# Patient Record
Sex: Male | Born: 1980 | Race: White | Hispanic: No | Marital: Single | Smoking: Current every day smoker
Health system: Southern US, Community
[De-identification: ages and names within clinical notes are randomized; demographics above are authoritative.]

## PROBLEM LIST (undated history)

## (undated) DIAGNOSIS — S42009A Fracture of unspecified part of unspecified clavicle, initial encounter for closed fracture: Secondary | ICD-10-CM

---

## 2000-10-15 ENCOUNTER — Encounter: Payer: Self-pay | Admitting: Emergency Medicine

## 2000-10-15 ENCOUNTER — Emergency Department (HOSPITAL_COMMUNITY): Admission: EM | Admit: 2000-10-15 | Discharge: 2000-10-15 | Payer: Self-pay | Admitting: Emergency Medicine

## 2000-12-19 ENCOUNTER — Encounter: Payer: Self-pay | Admitting: Internal Medicine

## 2000-12-19 ENCOUNTER — Emergency Department (HOSPITAL_COMMUNITY): Admission: EM | Admit: 2000-12-19 | Discharge: 2000-12-19 | Payer: Self-pay | Admitting: Internal Medicine

## 2000-12-31 ENCOUNTER — Emergency Department (HOSPITAL_COMMUNITY): Admission: EM | Admit: 2000-12-31 | Discharge: 2000-12-31 | Payer: Self-pay | Admitting: *Deleted

## 2001-02-03 ENCOUNTER — Emergency Department (HOSPITAL_COMMUNITY): Admission: EM | Admit: 2001-02-03 | Discharge: 2001-02-03 | Payer: Self-pay | Admitting: Emergency Medicine

## 2001-02-21 ENCOUNTER — Encounter: Payer: Self-pay | Admitting: Emergency Medicine

## 2001-02-21 ENCOUNTER — Emergency Department (HOSPITAL_COMMUNITY): Admission: EM | Admit: 2001-02-21 | Discharge: 2001-02-21 | Payer: Self-pay | Admitting: Emergency Medicine

## 2001-04-06 ENCOUNTER — Emergency Department (HOSPITAL_COMMUNITY): Admission: EM | Admit: 2001-04-06 | Discharge: 2001-04-06 | Payer: Self-pay | Admitting: Emergency Medicine

## 2001-04-06 ENCOUNTER — Encounter: Payer: Self-pay | Admitting: Emergency Medicine

## 2001-06-05 ENCOUNTER — Emergency Department (HOSPITAL_COMMUNITY): Admission: EM | Admit: 2001-06-05 | Discharge: 2001-06-05 | Payer: Self-pay | Admitting: *Deleted

## 2001-06-05 ENCOUNTER — Encounter: Payer: Self-pay | Admitting: *Deleted

## 2001-07-04 ENCOUNTER — Emergency Department (HOSPITAL_COMMUNITY): Admission: EM | Admit: 2001-07-04 | Discharge: 2001-07-05 | Payer: Self-pay | Admitting: Internal Medicine

## 2001-07-04 ENCOUNTER — Encounter: Payer: Self-pay | Admitting: Internal Medicine

## 2001-07-04 ENCOUNTER — Emergency Department (HOSPITAL_COMMUNITY): Admission: EM | Admit: 2001-07-04 | Discharge: 2001-07-04 | Payer: Self-pay | Admitting: Emergency Medicine

## 2001-07-29 ENCOUNTER — Emergency Department (HOSPITAL_COMMUNITY): Admission: EM | Admit: 2001-07-29 | Discharge: 2001-07-29 | Payer: Self-pay | Admitting: Emergency Medicine

## 2001-08-10 ENCOUNTER — Emergency Department (HOSPITAL_COMMUNITY): Admission: EM | Admit: 2001-08-10 | Discharge: 2001-08-10 | Payer: Self-pay | Admitting: *Deleted

## 2001-09-30 ENCOUNTER — Emergency Department (HOSPITAL_COMMUNITY): Admission: EM | Admit: 2001-09-30 | Discharge: 2001-09-30 | Payer: Self-pay | Admitting: Emergency Medicine

## 2001-09-30 ENCOUNTER — Encounter: Payer: Self-pay | Admitting: Emergency Medicine

## 2001-11-09 ENCOUNTER — Emergency Department (HOSPITAL_COMMUNITY): Admission: EM | Admit: 2001-11-09 | Discharge: 2001-11-09 | Payer: Self-pay | Admitting: *Deleted

## 2001-11-09 ENCOUNTER — Encounter: Payer: Self-pay | Admitting: *Deleted

## 2001-11-30 ENCOUNTER — Emergency Department (HOSPITAL_COMMUNITY): Admission: EM | Admit: 2001-11-30 | Discharge: 2001-12-01 | Payer: Self-pay | Admitting: Emergency Medicine

## 2002-02-01 ENCOUNTER — Emergency Department (HOSPITAL_COMMUNITY): Admission: EM | Admit: 2002-02-01 | Discharge: 2002-02-02 | Payer: Self-pay | Admitting: Emergency Medicine

## 2002-02-02 ENCOUNTER — Encounter: Payer: Self-pay | Admitting: Emergency Medicine

## 2002-02-02 ENCOUNTER — Emergency Department (HOSPITAL_COMMUNITY): Admission: EM | Admit: 2002-02-02 | Discharge: 2002-02-02 | Payer: Self-pay | Admitting: Emergency Medicine

## 2002-04-15 ENCOUNTER — Emergency Department (HOSPITAL_COMMUNITY): Admission: EM | Admit: 2002-04-15 | Discharge: 2002-04-15 | Payer: Self-pay | Admitting: *Deleted

## 2002-06-23 ENCOUNTER — Emergency Department (HOSPITAL_COMMUNITY): Admission: EM | Admit: 2002-06-23 | Discharge: 2002-06-23 | Payer: Self-pay | Admitting: Emergency Medicine

## 2002-07-27 ENCOUNTER — Emergency Department (HOSPITAL_COMMUNITY): Admission: EM | Admit: 2002-07-27 | Discharge: 2002-07-27 | Payer: Self-pay | Admitting: Emergency Medicine

## 2002-07-31 ENCOUNTER — Emergency Department (HOSPITAL_COMMUNITY): Admission: EM | Admit: 2002-07-31 | Discharge: 2002-07-31 | Payer: Self-pay | Admitting: Emergency Medicine

## 2002-09-22 ENCOUNTER — Emergency Department (HOSPITAL_COMMUNITY): Admission: EM | Admit: 2002-09-22 | Discharge: 2002-09-22 | Payer: Self-pay | Admitting: Emergency Medicine

## 2003-05-23 ENCOUNTER — Emergency Department (HOSPITAL_COMMUNITY): Admission: EM | Admit: 2003-05-23 | Discharge: 2003-05-23 | Payer: Self-pay | Admitting: Emergency Medicine

## 2003-09-09 ENCOUNTER — Emergency Department (HOSPITAL_COMMUNITY): Admission: EM | Admit: 2003-09-09 | Discharge: 2003-09-10 | Payer: Self-pay | Admitting: *Deleted

## 2005-04-28 ENCOUNTER — Emergency Department (HOSPITAL_COMMUNITY): Admission: EM | Admit: 2005-04-28 | Discharge: 2005-04-29 | Payer: Self-pay | Admitting: *Deleted

## 2007-05-03 ENCOUNTER — Emergency Department (HOSPITAL_COMMUNITY): Admission: EM | Admit: 2007-05-03 | Discharge: 2007-05-03 | Payer: Self-pay | Admitting: Emergency Medicine

## 2012-09-26 ENCOUNTER — Emergency Department (HOSPITAL_COMMUNITY)
Admission: EM | Admit: 2012-09-26 | Discharge: 2012-09-26 | Disposition: A | Discharge status: Home / Self Care | Payer: Self-pay | Attending: Emergency Medicine | Admitting: Emergency Medicine

## 2012-09-26 ENCOUNTER — Encounter (HOSPITAL_COMMUNITY): Payer: Self-pay

## 2012-09-26 DIAGNOSIS — K047 Periapical abscess without sinus: Secondary | ICD-10-CM | POA: Insufficient documentation

## 2012-09-26 DIAGNOSIS — F17200 Nicotine dependence, unspecified, uncomplicated: Secondary | ICD-10-CM | POA: Insufficient documentation

## 2012-09-26 DIAGNOSIS — K08109 Complete loss of teeth, unspecified cause, unspecified class: Secondary | ICD-10-CM | POA: Insufficient documentation

## 2012-09-26 DIAGNOSIS — K029 Dental caries, unspecified: Secondary | ICD-10-CM | POA: Insufficient documentation

## 2012-09-26 DIAGNOSIS — G8911 Acute pain due to trauma: Secondary | ICD-10-CM | POA: Insufficient documentation

## 2012-09-26 MED ORDER — HYDROCODONE-ACETAMINOPHEN 5-325 MG PO TABS: 1.0000 | ORAL_TABLET | ORAL | Status: DC | PRN

## 2012-09-26 MED ORDER — AMOXICILLIN 500 MG PO CAPS: 500.0000 mg | ORAL_CAPSULE | Freq: Three times a day (TID) | ORAL | Status: AC

## 2012-09-26 MED ORDER — IBUPROFEN 600 MG PO TABS: 600.0000 mg | ORAL_TABLET | Freq: Four times a day (QID) | ORAL | Status: DC | PRN

## 2012-09-26 NOTE — ED Notes (Signed)
Pain right lower molar

## 2012-09-26 NOTE — ED Notes (Signed)
Pain rt mandibular molar for 3 days, The molar is broken and has  Cavity.

## 2012-09-26 NOTE — ED Provider Notes (Signed)
Medical screening examination/treatment/procedure(s) were performed by non-physician practitioner and as supervising physician I was immediately available for consultation/collaboration.  Flint Melter, MD 09/26/12 2330

## 2012-09-26 NOTE — ED Provider Notes (Signed)
History     CSN: [id number]  Arrival date & time 09/26/12  1904   First MD Initiated Contact with Patient 09/26/12 2012      Chief Complaint  Patient presents with  . Dental Pain    (Consider location/radiation/quality/duration/timing/severity/associated sxs/prior treatment) HPI Comments: Marcus Gill is a 32 y.o. male presenting with a 3 day history of dental pain and gingival swelling.   The patient has a history of injury to the tooth involved which has recently started to cause pain.  There has been no fevers,  Chills, nausea or vomiting, also no complaint of difficulty swallowing,  Although chewing makes pain worse.  The patient has tried tylenol and ibuprofen without relief of symptoms.      The history is provided by the patient.    History reviewed. No pertinent past medical history.  History reviewed. No pertinent past surgical history.  History reviewed. No pertinent family history.  History  Substance Use Topics  . Smoking status: Current Every Day Smoker -- 1.00 packs/day for 15 years    Types: Cigarettes  . Smokeless tobacco: Not on file  . Alcohol Use: No      Review of Systems  Constitutional: Negative for fever.  HENT: Positive for dental problem. Negative for sore throat, facial swelling, neck pain and neck stiffness.   Respiratory: Negative for shortness of breath.     Allergies  Darvocet  Home Medications   Current Outpatient Rx  Name  Route  Sig  Dispense  Refill  . acetaminophen (TYLENOL) 500 MG tablet   Oral   Take 500-1,000 mg by mouth daily as needed for pain.         Marland Kitchen ibuprofen (ADVIL,MOTRIN) 200 MG tablet   Oral   Take 400 mg by mouth daily as needed for pain.         Marland Kitchen amoxicillin (AMOXIL) 500 MG capsule   Oral   Take 1 capsule (500 mg total) by mouth 3 (three) times daily.   30 capsule   0   . HYDROcodone-acetaminophen (NORCO/VICODIN) 5-325 MG per tablet   Oral   Take 1 tablet by mouth every 4 (four) hours as needed  for pain.   15 tablet   0   . ibuprofen (ADVIL,MOTRIN) 600 MG tablet   Oral   Take 1 tablet (600 mg total) by mouth every 6 (six) hours as needed for pain.   30 tablet   0     BP 134/86  Pulse 71  Temp(Src) 97.8 F (36.6 C) (Oral)  Resp 16  Ht 6' (1.829 m)  Wt 159 lb (72.122 kg)  BMI 21.56 kg/m2  SpO2 100%  Physical Exam  Constitutional: He is oriented to person, place, and time. He appears well-developed and well-nourished. No distress.  HENT:  Head: Normocephalic and atraumatic. No trismus in the jaw.  Right Ear: Tympanic membrane and external ear normal.  Left Ear: Tympanic membrane and external ear normal.  Mouth/Throat: Oropharynx is clear and moist and mucous membranes are normal. No oral lesions. Dental abscesses present.    Generalized poor dentition with several missing teeth, multiple teeth with deep cavities.  Poor oral hygiene.  Eyes: Conjunctivae are normal.  Neck: Normal range of motion. Neck supple.  Cardiovascular: Normal rate and normal heart sounds.   Pulmonary/Chest: Effort normal.  Abdominal: He exhibits no distension.  Musculoskeletal: Normal range of motion.  Lymphadenopathy:    He has no cervical adenopathy.  Neurological: He is alert and oriented  to person, place, and time.  Skin: Skin is warm and dry. No erythema.  Psychiatric: He has a normal mood and affect.    ED Course  Procedures (including critical care time)  Labs Reviewed - No data to display No results found.   1. Dental abscess       MDM  Patient was prescribed Amoxil and hydrocodone.  He was given referrals to local dentist and the free clinic coming up within the next month.  The patient appears reasonably screened and/or stabilized for discharge and I doubt any other medical condition or other [facility] requiring further screening, evaluation, or treatment in the ED at this time prior to discharge.         Burgess Amor, PA-C 09/26/12 2103

## 2012-10-18 ENCOUNTER — Emergency Department (HOSPITAL_COMMUNITY)
Admission: EM | Admit: 2012-10-18 | Discharge: 2012-10-18 | Disposition: A | Discharge status: Home / Self Care | Payer: Self-pay | Attending: Emergency Medicine | Admitting: Emergency Medicine

## 2012-10-18 ENCOUNTER — Encounter (HOSPITAL_COMMUNITY): Payer: Self-pay | Admitting: Emergency Medicine

## 2012-10-18 DIAGNOSIS — F17200 Nicotine dependence, unspecified, uncomplicated: Secondary | ICD-10-CM | POA: Insufficient documentation

## 2012-10-18 DIAGNOSIS — R6884 Jaw pain: Secondary | ICD-10-CM | POA: Insufficient documentation

## 2012-10-18 DIAGNOSIS — K029 Dental caries, unspecified: Secondary | ICD-10-CM | POA: Insufficient documentation

## 2012-10-18 DIAGNOSIS — K089 Disorder of teeth and supporting structures, unspecified: Secondary | ICD-10-CM | POA: Insufficient documentation

## 2012-10-18 MED ORDER — KETOROLAC TROMETHAMINE 10 MG PO TABS
10.0000 mg | ORAL_TABLET | Freq: Once | ORAL | Status: AC
  Administered 2012-10-18: 10 mg via ORAL
  Filled 2012-10-18: qty 1

## 2012-10-18 MED ORDER — LIDOCAINE HCL (PF) 1 % IJ SOLN
INTRAMUSCULAR | Status: AC
  Administered 2012-10-18: 5 mL
  Filled 2012-10-18: qty 5

## 2012-10-18 MED ORDER — CEFTRIAXONE SODIUM 1 G IJ SOLR
1.0000 g | Freq: Once | INTRAMUSCULAR | Status: AC
  Administered 2012-10-18: 1 g via INTRAMUSCULAR
  Filled 2012-10-18: qty 10

## 2012-10-18 MED ORDER — ONDANSETRON HCL 4 MG PO TABS
4.0000 mg | ORAL_TABLET | Freq: Once | ORAL | Status: AC
  Administered 2012-10-18: 4 mg via ORAL
  Filled 2012-10-18: qty 1

## 2012-10-18 MED ORDER — HYDROCODONE-ACETAMINOPHEN 5-325 MG PO TABS
2.0000 | ORAL_TABLET | Freq: Once | ORAL | Status: AC
  Administered 2012-10-18: 2 via ORAL
  Filled 2012-10-18: qty 2

## 2012-10-18 MED ORDER — MELOXICAM 7.5 MG PO TABS: ORAL_TABLET | ORAL | Status: DC

## 2012-10-18 MED ORDER — HYDROCODONE-ACETAMINOPHEN 5-325 MG PO TABS: 1.0000 | ORAL_TABLET | ORAL | Status: DC | PRN

## 2012-10-18 MED ORDER — PENICILLIN V POTASSIUM 500 MG PO TABS: ORAL_TABLET | ORAL | Status: DC

## 2012-10-18 NOTE — ED Provider Notes (Signed)
History     CSN: [id number]  Arrival date & time 10/18/12  1451   First MD Initiated Contact with Patient 10/18/12 [phone]      Chief Complaint  Patient presents with  . Dental Pain    (Consider location/radiation/quality/duration/timing/severity/associated sxs/prior treatment) Patient is a 32 y.o. male presenting with tooth pain.  Dental PainThe primary symptoms include mouth pain. Primary symptoms do not include shortness of breath or cough. The symptoms began more than 1 week ago. The symptoms are worsening. The symptoms occur constantly.  Additional symptoms include: gum swelling and jaw pain. Additional symptoms do not include: nosebleeds. Medical issues include: smoking.    History reviewed. No pertinent past medical history.  History reviewed. No pertinent past surgical history.  History reviewed. No pertinent family history.  History  Substance Use Topics  . Smoking status: Current Every Day Smoker -- 1.00 packs/day for 15 years    Types: Cigarettes  . Smokeless tobacco: Not on file  . Alcohol Use: No      Review of Systems  Constitutional: Negative for activity change.       All ROS Neg except as noted in HPI  HENT: Positive for dental problem. Negative for nosebleeds and neck pain.   Eyes: Negative for photophobia and discharge.  Respiratory: Negative for cough, shortness of breath and wheezing.   Cardiovascular: Negative for chest pain and palpitations.  Gastrointestinal: Negative for abdominal pain and blood in stool.  Genitourinary: Negative for dysuria, frequency and hematuria.  Musculoskeletal: Negative for back pain and arthralgias.  Skin: Negative.   Neurological: Negative for dizziness, seizures and speech difficulty.  Psychiatric/Behavioral: Negative for hallucinations and confusion.    Allergies  Darvocet  Home Medications   Current Outpatient Rx  Name  Route  Sig  Dispense  Refill  . acetaminophen (TYLENOL) 500 MG tablet   Oral   Take  500-1,000 mg by mouth daily as needed for pain.         Marland Kitchen HYDROcodone-acetaminophen (NORCO/VICODIN) 5-325 MG per tablet   Oral   Take 1 tablet by mouth every 4 (four) hours as needed for pain.   15 tablet   0   . ibuprofen (ADVIL,MOTRIN) 200 MG tablet   Oral   Take 400 mg by mouth daily as needed for pain.         Marland Kitchen ibuprofen (ADVIL,MOTRIN) 600 MG tablet   Oral   Take 1 tablet (600 mg total) by mouth every 6 (six) hours as needed for pain.   30 tablet   0     BP 118/79  Pulse 79  Temp(Src) 97.8 F (36.6 C) (Oral)  Resp 18  SpO2 99%  Physical Exam  Nursing note and vitals reviewed. Constitutional: He is oriented to person, place, and time. He appears well-developed and well-nourished.  Non-toxic appearance.  HENT:  Head: Normocephalic.  Right Ear: Tympanic membrane and external ear normal.  Left Ear: Tympanic membrane and external ear normal.  Mouth/Throat: Uvula is midline. Dental caries present.    Eyes: EOM and lids are normal. Pupils are equal, round, and reactive to light.  Neck: Normal range of motion. Neck supple. Carotid bruit is not present.  Cardiovascular: Normal rate, regular rhythm, normal heart sounds, intact distal pulses and normal pulses.   Pulmonary/Chest: Breath sounds normal. No respiratory distress.  Abdominal: Soft. Bowel sounds are normal. There is no tenderness. There is no guarding.  Musculoskeletal: Normal range of motion.  Lymphadenopathy:       Head (  right side): No submandibular adenopathy present.       Head (left side): No submandibular adenopathy present.    He has no cervical adenopathy.  Neurological: He is alert and oriented to person, place, and time. He has normal strength. No cranial nerve deficit or sensory deficit.  Skin: Skin is warm and dry.  Psychiatric: He has a normal mood and affect. His speech is normal.    ED Course  Procedures (including critical care time)  Labs Reviewed - No data to display No results  found.   No diagnosis found.    MDM  I have reviewed nursing notes, vital signs, and all appropriate lab and imaging results for this patient. The patient has had problems with dental cavities for quite some time. He was seen on April 25 with abscess and swelling and pain involving the right side of his jaw. The pain got" a little better" and that came back. There's been no fever or chills reported.  The patient has a deep cavity of the first lower molar on the right side. The plan at this time is for the patient be treated with an intramuscular injection of Rocephin. He will again be treated with amoxicillin, Mobic for swelling, and Norco for pain. Patient states he plans to be seen by a free clinic on May 30.       Kathie Dike, PA-C 10/18/12 1529

## 2012-10-18 NOTE — ED Provider Notes (Signed)
Medical screening examination/treatment/procedure(s) were performed by non-physician practitioner and as supervising physician I was immediately available for consultation/collaboration.  Aliesha Dolata, MD 10/18/12 2351 

## 2012-10-18 NOTE — ED Notes (Signed)
Pt presents with abscess to right side on mouth. Pt states he was seen here on 4/25 and given abx. Pt states abscess initially "went down" but states "it came back and now it is bigger and more painful". Pt denies any drainage from area.

## 2013-09-19 ENCOUNTER — Emergency Department (HOSPITAL_COMMUNITY)
Admission: EM | Admit: 2013-09-19 | Discharge: 2013-09-19 | Disposition: A | Discharge status: Home / Self Care | Payer: Self-pay | Attending: Emergency Medicine | Admitting: Emergency Medicine

## 2013-09-19 ENCOUNTER — Encounter (HOSPITAL_COMMUNITY): Payer: Self-pay | Admitting: Emergency Medicine

## 2013-09-19 DIAGNOSIS — M546 Pain in thoracic spine: Secondary | ICD-10-CM

## 2013-09-19 DIAGNOSIS — Y9389 Activity, other specified: Secondary | ICD-10-CM | POA: Insufficient documentation

## 2013-09-19 DIAGNOSIS — Z792 Long term (current) use of antibiotics: Secondary | ICD-10-CM | POA: Insufficient documentation

## 2013-09-19 DIAGNOSIS — X500XXA Overexertion from strenuous movement or load, initial encounter: Secondary | ICD-10-CM | POA: Insufficient documentation

## 2013-09-19 DIAGNOSIS — Y929 Unspecified place or not applicable: Secondary | ICD-10-CM | POA: Insufficient documentation

## 2013-09-19 DIAGNOSIS — IMO0002 Reserved for concepts with insufficient information to code with codable children: Secondary | ICD-10-CM | POA: Insufficient documentation

## 2013-09-19 DIAGNOSIS — Z791 Long term (current) use of non-steroidal anti-inflammatories (NSAID): Secondary | ICD-10-CM | POA: Insufficient documentation

## 2013-09-19 DIAGNOSIS — F17200 Nicotine dependence, unspecified, uncomplicated: Secondary | ICD-10-CM | POA: Insufficient documentation

## 2013-09-19 MED ORDER — CYCLOBENZAPRINE HCL 10 MG PO TABS: 10.0000 mg | ORAL_TABLET | Freq: Two times a day (BID) | ORAL | Status: DC | PRN

## 2013-09-19 MED ORDER — HYDROCODONE-ACETAMINOPHEN 5-325 MG PO TABS: 1.0000 | ORAL_TABLET | ORAL | Status: DC | PRN

## 2013-09-19 MED ORDER — CYCLOBENZAPRINE HCL 10 MG PO TABS
10.0000 mg | ORAL_TABLET | Freq: Once | ORAL | Status: AC
  Administered 2013-09-19: 10 mg via ORAL
  Filled 2013-09-19: qty 1

## 2013-09-19 MED ORDER — OXYCODONE-ACETAMINOPHEN 5-325 MG PO TABS
1.0000 | ORAL_TABLET | Freq: Once | ORAL | Status: AC
  Administered 2013-09-19: 1 via ORAL
  Filled 2013-09-19: qty 1

## 2013-09-19 NOTE — Discharge Instructions (Signed)
You can continue to take ibuprofen in addition to the medications we give you. Do not take the narcotic or muscle relaxant if you are driving because they will make you sleepy.

## 2013-09-19 NOTE — ED Notes (Signed)
Patient c/o mid back pain. Per patient was moving concrete blocks when he stood from bending over he felt a pop. Per patient since then he has had pain that has progressively gotten worse. Patient reports taking tylenol, goody powder and ibuprofen with no relief. Per patient uanble to sleep from the pain.

## 2013-09-19 NOTE — ED Provider Notes (Signed)
CSN: [id number]     Arrival date & time 09/19/13  1557 History   First MD Initiated Contact with Patient 09/19/13 1614     Chief Complaint  Patient presents with  . Back Pain     (Consider location/radiation/quality/duration/timing/severity/associated sxs/prior Treatment) Patient is a 33 y.o. male presenting with back pain. The history is provided by the patient.  Back Pain Location:  Thoracic spine Quality:  Stabbing and burning Radiates to:  Does not radiate Pain severity:  Severe Onset quality:  Sudden Timing:  Constant Progression:  Worsening Chronicity:  New Context: physical stress   Relieved by:  Nothing Worsened by:  Ambulation, movement and twisting Ineffective treatments:  Ibuprofen, OTC medications and heating pad Associated symptoms: no abdominal pain, no bladder incontinence, no bowel incontinence, no chest pain, no dysuria, no fever, no headaches, no leg pain, no paresthesias, no tingling and no weakness    Holly BodilySamuel M Platten is a 33 y.o. male who presents to the ED with mid back pain that started after picking up a concrete block. He was bending forward and when he stood he felt a pop. Since then he has had pain that has gotten worse. He took tylenol and goody powder and ibuprofen without relief. He has had trouble sleeping due to the pain.   History reviewed. No pertinent past medical history. History reviewed. No pertinent past surgical history. History reviewed. No pertinent family history. History  Substance Use Topics  . Smoking status: Current Every Day Smoker -- 1.00 packs/day for 15 years    Types: Cigarettes  . Smokeless tobacco: Never Used  . Alcohol Use: No    Review of Systems  Constitutional: Negative for fever and chills.  HENT: Negative.   Respiratory: Negative for cough and shortness of breath.   Cardiovascular: Negative for chest pain.  Gastrointestinal: Negative for nausea, vomiting, abdominal pain and bowel incontinence.  Genitourinary:  Negative for bladder incontinence, dysuria, urgency and frequency.  Musculoskeletal: Positive for back pain.  Skin: Negative for wound.  Neurological: Negative for tingling, syncope, weakness, headaches and paresthesias.  Psychiatric/Behavioral: Negative for confusion. The patient is not nervous/anxious.       Allergies  Darvocet  Home Medications   Prior to Admission medications   Medication Sig Start Date End Date Taking? Authorizing Provider  Aspirin-Acetaminophen-Caffeine (GOODY HEADACHE PO) Take 1 packet by mouth daily as needed (for pain).    Historical Provider, MD  HYDROcodone-acetaminophen (NORCO/VICODIN) 5-325 MG per tablet Take 1 tablet by mouth every 4 (four) hours as needed for pain. 10/18/12   Kathie DikeHobson M Bryant, PA-C  meloxicam (MOBIC) 7.5 MG tablet 1 po bid with food 10/18/12   Kathie DikeHobson M Bryant, PA-C  penicillin v potassium (VEETID) 500 MG tablet 2 po bid with food 10/18/12   Kathie DikeHobson M Bryant, PA-C   BP 144/78  Pulse 77  Temp(Src) 97.7 F (36.5 C) (Oral)  Resp 18  Ht 6' (1.829 m)  Wt 172 lb 11.2 oz (78.336 kg)  BMI 23.42 kg/m2  SpO2 100% Physical Exam  Nursing note and vitals reviewed. Constitutional: He is oriented to person, place, and time. He appears well-developed and well-nourished. No distress.  HENT:  Head: Normocephalic and atraumatic.  Eyes: EOM are normal. Pupils are equal, round, and reactive to light.  Neck: Normal range of motion. Neck supple.  Cardiovascular: Normal rate and regular rhythm.   Pulmonary/Chest: Effort normal. No respiratory distress. He has no wheezes. He has no rales.  Abdominal: Soft. Bowel sounds are normal. There  is no tenderness.  Musculoskeletal: Normal range of motion. He exhibits no edema.       Thoracic back: He exhibits tenderness, pain and spasm. He exhibits normal range of motion and normal pulse.  Neurological: He is alert and oriented to person, place, and time. He has normal strength. No cranial nerve deficit or sensory  deficit. Coordination and gait normal.  Reflex Scores:      Bicep reflexes are 2+ on the right side and 2+ on the left side.      Brachioradialis reflexes are 2+ on the right side and 2+ on the left side.      Patellar reflexes are 2+ on the right side and 2+ on the left side.      Achilles reflexes are 2+ on the right side and 2+ on the left side. Skin: Skin is warm and dry.  Psychiatric: He has a normal mood and affect. His behavior is normal.    ED Course  Procedures   MDM  33 y.o. male with thoracic pain s/p injury. Stable for discharge without neurological deficits. Will treat for pain and he will follow up with his PCP or return here for worsening symptoms.  Discussed with the patient and all questioned fully answered.    Medication List    TAKE these medications       cyclobenzaprine 10 MG tablet  Commonly known as:  FLEXERIL  Take 1 tablet (10 mg total) by mouth 2 (two) times daily as needed for muscle spasms.      ASK your doctor about these medications       GOODY HEADACHE PO  Take 1 packet by mouth daily as needed (for pain).     HYDROcodone-acetaminophen 5-325 MG per tablet  Commonly known as:  NORCO/VICODIN  Take 1 tablet by mouth every 4 (four) hours as needed for pain.  Ask about: Which instructions should I use?     HYDROcodone-acetaminophen 5-325 MG per tablet  Commonly known as:  NORCO/VICODIN  Take 1 tablet by mouth every 4 (four) hours as needed.  Ask about: Which instructions should I use?     meloxicam 7.5 MG tablet  Commonly known as:  MOBIC  1 po bid with food     penicillin v potassium 500 MG tablet  Commonly known as:  VEETID  2 po bid with food          Janne NapoleonHope M Neese, NP 09/20/13 2123

## 2013-09-20 NOTE — ED Provider Notes (Signed)
Medical screening examination/treatment/procedure(s) were performed by non-physician practitioner and as supervising physician I was immediately available for consultation/collaboration.  Bransen Fassnacht LocGerhard Munchkwood, MD 09/20/13 [id number]

## 2014-01-31 ENCOUNTER — Emergency Department (HOSPITAL_COMMUNITY)
Admission: EM | Admit: 2014-01-31 | Discharge: 2014-01-31 | Disposition: A | Discharge status: Home / Self Care | Payer: Self-pay | Attending: Emergency Medicine | Admitting: Emergency Medicine

## 2014-01-31 ENCOUNTER — Emergency Department (HOSPITAL_COMMUNITY): Payer: Self-pay

## 2014-01-31 ENCOUNTER — Encounter (HOSPITAL_COMMUNITY): Payer: Self-pay | Admitting: Emergency Medicine

## 2014-01-31 DIAGNOSIS — T148XXA Other injury of unspecified body region, initial encounter: Secondary | ICD-10-CM

## 2014-01-31 DIAGNOSIS — Y9389 Activity, other specified: Secondary | ICD-10-CM | POA: Insufficient documentation

## 2014-01-31 DIAGNOSIS — IMO0002 Reserved for concepts with insufficient information to code with codable children: Secondary | ICD-10-CM | POA: Insufficient documentation

## 2014-01-31 DIAGNOSIS — Y929 Unspecified place or not applicable: Secondary | ICD-10-CM | POA: Insufficient documentation

## 2014-01-31 DIAGNOSIS — X500XXA Overexertion from strenuous movement or load, initial encounter: Secondary | ICD-10-CM | POA: Insufficient documentation

## 2014-01-31 MED ORDER — METHOCARBAMOL 500 MG PO TABS
1000.0000 mg | ORAL_TABLET | Freq: Once | ORAL | Status: AC
  Administered 2014-01-31: 1000 mg via ORAL
  Filled 2014-01-31: qty 2

## 2014-01-31 MED ORDER — PREDNISONE 50 MG PO TABS
60.0000 mg | ORAL_TABLET | Freq: Once | ORAL | Status: AC
  Administered 2014-01-31: 60 mg via ORAL
  Filled 2014-01-31 (×2): qty 1

## 2014-01-31 MED ORDER — KETOROLAC TROMETHAMINE 10 MG PO TABS
10.0000 mg | ORAL_TABLET | Freq: Once | ORAL | Status: AC
  Administered 2014-01-31: 10 mg via ORAL
  Filled 2014-01-31: qty 1

## 2014-01-31 MED ORDER — BACLOFEN 10 MG PO TABS: 10.0000 mg | ORAL_TABLET | Freq: Three times a day (TID) | ORAL | Status: AC

## 2014-01-31 MED ORDER — DICLOFENAC SODIUM 75 MG PO TBEC: 75.0000 mg | DELAYED_RELEASE_TABLET | Freq: Two times a day (BID) | ORAL | Status: DC

## 2014-01-31 MED ORDER — DEXAMETHASONE 4 MG PO TABS: ORAL_TABLET | ORAL | Status: DC

## 2014-01-31 NOTE — Discharge Instructions (Signed)
Please use a heating pad to the back. Use medications as prescribed. Baclofen may cause drowsiness, please use with caution. Please rest your back is much as possible. Muscle Strain A muscle strain (pulled muscle) happens when a muscle is stretched beyond normal length. It happens when a sudden, violent force stretches your muscle too far. Usually, a few of the fibers in your muscle are torn. Muscle strain is common in athletes. Recovery usually takes 1-2 weeks. Complete healing takes 5-6 weeks.  HOME CARE   Follow the PRICE method of treatment to help your injury get better. Do this the first 2-3 days after the injury:  Protect. Protect the muscle to keep it from getting injured again.  Rest. Limit your activity and rest the injured body part.  Ice. Put ice in a plastic bag. Place a towel between your skin and the bag. Then, apply the ice and leave it on from 15-20 minutes each hour. After the third day, switch to moist heat packs.  Compression. Use a splint or elastic bandage on the injured area for comfort. Do not put it on too tightly.  Elevate. Keep the injured body part above the level of your heart.  Only take medicine as told by your doctor.  Warm up before doing exercise to prevent future muscle strains. GET HELP IF:   You have more pain or puffiness (swelling) in the injured area.  You feel numbness, tingling, or notice a loss of strength in the injured area. MAKE SURE YOU:   Understand these instructions.  Will watch your condition.  Will get help right away if you are not doing well or get worse. Document Released: 02/28/2008 Document Revised: 03/11/2013 Document Reviewed: 12/18/2012 [facility] Patient Information 2015 [place], [place]. This information is not intended to replace advice given to you by your health care provider. Make sure you discuss any questions you have with your health care provider.

## 2014-01-31 NOTE — ED Notes (Signed)
Pt states the he was moving things around yesterday when he "heard a pop, and felt a sudden sharp pain in lower back." Pt states pain is unrelieved by Aleve or Goody's. Pt denies N/V/D.

## 2014-01-31 NOTE — ED Provider Notes (Signed)
CSN: [id number]     Arrival date & time 01/31/14  1137 History   First MD Initiated Contact with Patient 01/31/14 1234    This chart was scribed for non-physician practitioner Ivery Quale, PA-C working with Glynn Octave, MD, by Andrew Au, ED Scribe. This patient was seen in room APFT21/APFT21 and the patient's care was started at 1:21 PM. Chief Complaint  Patient presents with  . Back Pain   Patient is a 33 y.o. male presenting with back pain. The history is provided by the patient. No language interpreter was used.  Back Pain Associated symptoms: no numbness and no weakness     Marcus Gill is a 33 y.o. male who presents to the Emergency Department complaining of sudden onset, intermittent  mid back x 1 day around 3pm. Pt states he was moving cinder blocks yesterday when he heard a "pop" in his back. Pt stopped working immediately after hearing the "pop". Pt now has intermittent, sharp back pain that come in 20-30 min intervals.  Pt tried using a heating pad, goody's and aleve.  Pt denies loss of bowels. Pt denies trouble with gait. Pt denies operation, procedures or back problems in the past. Pt denies prior injury.      History reviewed. No pertinent past medical history. History reviewed. No pertinent past surgical history. History reviewed. No pertinent family history. History  Substance Use Topics  . Smoking status: Current Every Day Smoker -- 0.50 packs/day for 15 years    Types: Cigarettes  . Smokeless tobacco: Never Used  . Alcohol Use: No    Review of Systems  Musculoskeletal: Positive for back pain. Negative for gait problem.  Neurological: Negative for weakness and numbness.  All other systems reviewed and are negative.  Allergies  Darvocet  Home Medications   Prior to Admission medications   Medication Sig Start Date End Date Taking? Authorizing Provider  Aspirin-Acetaminophen-Caffeine (GOODY HEADACHE PO) Take 1 packet by mouth daily as needed (for pain).    Yes Historical Provider, MD   BP 137/72  Pulse 84  Temp(Src) 98.6 F (37 C) (Oral)  Resp 18  Ht  (1.854 m)  Wt 171 lb (77.565 kg)  BMI 22.57 kg/m2  SpO2 100% Physical Exam  Nursing note and vitals reviewed. Constitutional: He is oriented to person, place, and time. He appears well-developed and well-nourished. No distress.  HENT:  Head: Normocephalic and atraumatic.  Eyes: Conjunctivae and EOM are normal.  Neck: Neck supple.  Cardiovascular: Normal rate.   Pulmonary/Chest: Effort normal.  Musculoskeletal: Normal range of motion.  Paraspinal spasm in the thoracic area. No palpable step off of the thoracic or lumbar area.  Neurological: He is alert and oriented to person, place, and time. No cranial nerve deficit or sensory deficit. He exhibits normal muscle tone. Coordination normal.  Skin: Skin is warm and dry.  Psychiatric: He has a normal mood and affect. His behavior is normal.    ED Course  Procedures (including critical care time) DIAGNOSTIC STUDIES: Oxygen Saturation is 100% on RA, normal by my interpretation.    COORDINATION OF CARE: 1:23 PM- Pt advised of plan for treatment which includes a muscle relaxer and pt agrees.  Labs Review Labs Reviewed - No data to display  Imaging Review No results found.   EKG Interpretation None      No gross neurologic deficit appreciated. X-ray is negative for compression fracture or other problems of the thoracic spine. Suspect muscle strain involving the lower back. Prescription  for dexamethasone and Voltaren given to the patient. Patient advised to rest his back is much as possible. Patient will return if any changes, problems, or deterioration in his general condition.    Final diagnoses:  None    I personally performed the services described in this documentation, which was scribed in my presence. The recorded information has been reviewed and is accurate.      Kathie Dike, PA-C 02/02/14 2124

## 2014-02-03 NOTE — ED Provider Notes (Signed)
Medical screening examination/treatment/procedure(s) were performed by non-physician practitioner and as supervising physician I was immediately available for consultation/collaboration.   EKG Interpretation None        Ivery Nanney, MD 02/03/14 1106 

## 2014-03-21 ENCOUNTER — Emergency Department (HOSPITAL_COMMUNITY)
Admission: EM | Admit: 2014-03-21 | Discharge: 2014-03-21 | Disposition: A | Discharge status: Home / Self Care | Payer: Self-pay | Attending: Emergency Medicine | Admitting: Emergency Medicine

## 2014-03-21 ENCOUNTER — Encounter (HOSPITAL_COMMUNITY): Payer: Self-pay | Admitting: Emergency Medicine

## 2014-03-21 DIAGNOSIS — K0889 Other specified disorders of teeth and supporting structures: Secondary | ICD-10-CM

## 2014-03-21 DIAGNOSIS — H9209 Otalgia, unspecified ear: Secondary | ICD-10-CM | POA: Insufficient documentation

## 2014-03-21 DIAGNOSIS — K088 Other specified disorders of teeth and supporting structures: Secondary | ICD-10-CM | POA: Insufficient documentation

## 2014-03-21 DIAGNOSIS — Z720 Tobacco use: Secondary | ICD-10-CM | POA: Insufficient documentation

## 2014-03-21 DIAGNOSIS — K029 Dental caries, unspecified: Secondary | ICD-10-CM | POA: Insufficient documentation

## 2014-03-21 DIAGNOSIS — Z7982 Long term (current) use of aspirin: Secondary | ICD-10-CM | POA: Insufficient documentation

## 2014-03-21 MED ORDER — TRAMADOL HCL 50 MG PO TABS: 50.0000 mg | ORAL_TABLET | Freq: Four times a day (QID) | ORAL | Status: DC | PRN

## 2014-03-21 MED ORDER — IBUPROFEN 800 MG PO TABS: 800.0000 mg | ORAL_TABLET | Freq: Three times a day (TID) | ORAL | Status: DC

## 2014-03-21 MED ORDER — AMOXICILLIN 500 MG PO CAPS: 500.0000 mg | ORAL_CAPSULE | Freq: Three times a day (TID) | ORAL | Status: DC

## 2014-03-21 NOTE — Discharge Instructions (Signed)
Dental Pain °Toothache is pain in or around a tooth. It may get worse with chewing or with cold or heat.  °HOME CARE °· Your dentist may use a numbing medicine during treatment. If so, you may need to avoid eating until the medicine wears off. Ask your dentist about this. °· Only take medicine as told by your dentist or doctor. °· Avoid chewing food near the painful tooth until after all treatment is done. Ask your dentist about this. °GET HELP RIGHT AWAY IF:  °· The problem gets worse or new problems appear. °· You have a fever. °· There is redness and puffiness (swelling) of the face, jaw, or neck. °· You cannot open your mouth. °· There is pain in the jaw. °· There is very bad pain that is not helped by medicine. °MAKE SURE YOU:  °· Understand these instructions. °· Will watch your condition. °· Will get help right away if you are not doing well or get worse. °Document Released: 11/07/2007 Document Revised: 08/13/2011 Document Reviewed: 11/07/2007 °ExitCare® Patient Information ©2015 [employer]. This information is not intended to replace advice given to you by your health care provider. Make sure you discuss any questions you have with your health care provider. ° ° ° °Emergency Department Resource Guide °1) Find a Doctor and Pay Out of Pocket °Although you won't have to find out who is covered by your insurance plan, it is a good idea to ask around and get recommendations. You will then need to call the office and see if the doctor you have chosen will accept you as a new patient and what types of options they offer for patients who are self-pay. Some doctors offer discounts or will set up payment plans for their patients who do not have insurance, but you will need to ask so you aren't surprised when you get to your appointment. ° °2) Contact Your Local Health Department °Not all health departments have doctors that can see patients for sick visits, but many do, so it is worth a call to see if yours does.  If you don't know where your local health department is, you can check in your phone book. The CDC also has a tool to help you locate your state's health department, and many state websites also have listings of all of their local health departments. ° °3) Find a Walk-in Clinic °If your illness is not likely to be very severe or complicated, you may want to try a walk in clinic. These are popping up all over the country in pharmacies, drugstores, and shopping centers. They're usually staffed by nurse practitioners or physician assistants that have been trained to treat common illnesses and complaints. They're usually fairly quick and inexpensive. However, if you have serious medical issues or chronic medical problems, these are probably not your best option. ° °No Primary Care Doctor: °- Call [facility] at  832-8000 - they can help you locate a primary care doctor that  accepts your insurance, provides certain services, etc. °- Physician Referral Service- 1-800-533-3463 ° °Chronic Pain Problems: °Organization         Address  Phone   Notes  °[facility]  (336) 297-2271 Patients need to be referred by their primary care doctor.  ° °Medication Assistance: °Organization         Address  Phone   Notes  °[place] [facility] [address] °[place], [place] [address] (336) 641-8030 --Must be a resident   of [place] °-- Must have NO insurance coverage whatsoever (no Medicaid/ Medicare, etc.) °-- The pt. MUST have a primary care doctor that directs their care regularly and follows them in the community °  °[facility]  (866) 331-1348   °[employer]  (888) 892-1162   ° °Agencies that provide inexpensive medical care: °Organization         Address  Phone   Notes  °[facility]  (336) 832-8035   °[facility]    (336) 832-7272   °[facility] [address] °[place], [place] [address] (336) 832-4777   °[facility] [address], °[place] (336) 271-4999   °[facility]    (336) 373-0678   °[facility]    (336) 272-1050   °[facility] ° [address], [place] Phone:  (336) 832-4444, Fax:  (336) 832-4440 Hours of Operation:  9 am - 6 pm, M-F.  Also accepts Medicaid/Medicare and self-pay.  °[facility] ° [address], [place] Phone: (336) 832-3150, Fax: (336) 832-3151. Hours of Operation:  8:30 am - 5:30 pm, M-F.  Also accepts Medicaid and self-pay.  °[facility] [place] [address], [place] Phone: (336) 878-6027   °[facility] [address], [place], [place] (336)723-1848, Ext. 123 Mondays & Thursdays: 7-9 AM.  First 15 patients are seen on a first come, first serve basis. °  ° °Medicaid-accepting [place] Providers: ° °Organization         Address  Phone   Notes  °[facility] [address], [place] (336) 641-2100 Also accepts self-pay patients.  °[facility] [address], [place] ° (336) 856-9996   °[facility] [address], [place] (336) 288-8857   °[facility] [address] [place] [address], [place] (336) 299-7000   °Veita Bland [address], [place]  ° (336) 373-1557 Only accepts [place] Access Medicaid patients after they have their name applied to their card.  ° °Self-Pay (no insurance) in [place]: ° °Organization         Address  Phone   Notes  °Sickle Cell Patients, [facility] [address], [place] (336) 832-1970   °[facility] [address], [place] (336) 832-4400   °[facility] ° [address], [place] (336) 992-4800   °[facility]/Dr. Osei-Bonsu ° [address] [place] [address], [place] or [address], [place] (336) 841-8500  Phone number for both [place] and [place] locations is the same.  °[facility] [address], [place] (336) 299-0000   °[facility] [address] [place] [address],  or [address] (336) 852-7530 °(336) 878-2260   °[facility] [address],  (336) 350-1642, phone; (336) 294-5005, fax Sees patients 1st and 3rd Saturday of every month.  Must not qualify for public or private insurance (i.e. Medicaid, Medicare, [place] Health Choice, Veterans' Benefits) • Household income should be no more than 200% of the poverty level •The clinic cannot treat you if you are pregnant or think you are pregnant • Sexually transmitted diseases are not treated at the clinic.  ° ° °Dental Care: °Organization         Address    Phone  Notes  °[facility] [facility] [address], [place] (336) 641-6152 Accepts children up to age 21 who are enrolled in Medicaid or [place] Health Choice; pregnant women with a Medicaid card; and children who have applied for Medicaid or [place] Health Choice, but were declined, whose parents can pay a reduced fee at time of service.  °[facility] [place]  [address], [place] (336) 641-7733 Accepts children up to age 21 who are enrolled in Medicaid or [place] Health Choice; pregnant women with a Medicaid card; and children who have applied for Medicaid or [place] Health Choice, but were declined, whose parents can pay a reduced fee at time of service.  °[facility] ° [address], [place] (336) 641-4533 Patients are seen by appointment only. Walk-ins are not accepted. [facility] will see patients 18 years of age and older. °Monday - Tuesday (8am-5pm) °Most Wednesdays (8:30-5pm) °$30 per visit, cash only  °[facility] ° [address], [place] (336) 641-4533 Patients are seen by appointment  only. Walk-ins are not accepted. [facility] will see patients 18 years of age and older. °One Wednesday Evening (Monthly: Volunteer Based).  $30 per visit, cash only  °[facility]  (919) 537-3737 for adults; Children under age 4, call Graduate Pediatric Dentistry at (919) [phone]. Children aged 4-14, please call (919) 537-3737 to request a pediatric application. ° Dental services are provided in all areas of dental care including fillings, crowns and bridges, complete and partial dentures, implants, gum treatment, root canals, and extractions. Preventive care is also provided. Treatment is provided to both adults and children. °Patients are selected via a lottery and there is often a waiting list. °  °[facility] [address], °[place] ° (336) 763-8833 [email or web address] °  °[facility] [address], [place], [place] (336)723-1848, Ext. 123 Second and Fourth Thursday of each month, opens at 6:30 AM; Clinic ends at 9 AM.  Patients are seen on a first-come first-served basis, and a limited number are seen during each clinic.  ° °[facility] ° [address], [place], [place] (336) 723-7904   Eligibility Requirements °You must have lived in [place], [place], or [place] counties for at least the last three months. °  You cannot be eligible for state or federal sponsored healthcare insurance, including Veterans Administration, Medicaid, or Medicare. °  You generally cannot be eligible for healthcare insurance through your employer.  °  How to apply: °Eligibility screenings are held every Tuesday and Wednesday afternoon from 1:00 pm until 4:00 pm. You do not need an appointment for the interview!  °[facility] [address], [place], [place] 336-631-2330   °[facility]  336-342-8273   °[facility]  336-703-3100   °[facility]  336-570-6415   ° °Behavioral Health  Resources in the Community: °Intensive Outpatient Programs °Organization         Address  Phone  Notes  °[facility] [address], [place], [place] 336-878-6098   °[facility] [address], [place], [place] 336-832-9800   °[facility] [address], [place], [place] ° 336-882-2125   °[facility] [address],  °[place], [place] 1-800-853-5163 or 336-641-4981   °Substance Abuse Resources °Organization           Address  Phone  Notes  °[facility]  336-882-2125   °[facility]  336-784-9470   °[facility]  336-285-9073   °[facility]  336-845-3988   °Residential & Outpatient Substance Abuse Program  1-800-659-3381   °Psychological Services °Organization         Address  Phone  Notes  °[facility]  336- 832-9600   °[facility]  336- 378-7881   °[facility] [address], [place] 1-800-853-5163 or 336-641-4981   ° °Mobile Crisis Teams °Organization         Address  Phone  Notes  °[facility], Mobile Crisis Care Unit  1-877-626-1772   °Assertive °[facility] ° [address] [place], [place] 336-834-9664   °Sharon DeEsch [address] °[place] 336-554-5454   ° °Self-Help/Support Groups °Organization         Address  Phone             Notes  °[facility] [place] - variety of support groups  336- 373-1402 Call for more information  °Narcotics Anonymous (NA), [facility] [address], °[place]  2 meetings at this location  ° °Residential Treatment Programs °Organization         Address  Phone  Notes  °[facility] [address],    °[place]  1-866-801-8205   °[facility] ° [address], [place], [place] 704-293-8524   °[facility] [address], [place] 336-845-3988 Admissions: 8am-3pm M-F  °[facility] [address]    °[place], [place] 336-841-1104   °[facility] [address], [place], [place] 336-379-7146   °[facility] [address]  °[place], [place] 336-285-9073   °[facility] Intensive Outpatient [address], [place], [place] 336-852-3033   °[facility] [address]  °[place], [place] 1-877-615-2722 or 336-784-9470   °[facility] [address] [place], [place] 336-227-7417 Accepts Medicaid  °[facility] [address]  °[place] 1-800-659-3381 Substance Abuse/Addiction Treatment  ° °[place] Behavioral Health Resources °Organization         Address  Phone  Notes  °[facility]  (888) 581-9988   °Julie Brannon, PhD [address] [place], [place]   (336) 349-5553 or (336) 951-0000   °[facility]   [address] °[place], [place] (336) 349-4454   °[facility] [address], [place], [place] (336) 342-8316 Insurance/Medicaid/sponsorship through [facility]  °[facility] [address]                                    [place], [place] (336) 342-8316 Therapy/tele-psych/case  °[facility] [address]  ° [place], [place] (336) 349-2233    °Dr. Arfeen  (336) 349-4544   °[facility] [place]  [employer] [place] [facility] 1) [address], [place] °2) [address], [place] °3)  [address], [place] (336) 349-3220 °(336) 342-7768 ° °(336) 342-8140   °[place] [facility] (336) 342-1394 or (336) 342-3537 (After Hours)    ° °  °

## 2014-03-21 NOTE — ED Provider Notes (Signed)
CSN: [id number]     Arrival date & time 03/21/14  1412 History  This chart was scribed for non-physician practitioner, Pauline Ausammy Trip Cavanagh, PA-C,working with Donnetta HutchingBrian Cook, MD, by Karle PlumberJennifer Tensley, ED Scribe. This patient was seen in room APFT21/APFT21 and the patient's care was started at 2:51 PM.  Chief Complaint  Patient presents with  . Dental Pain   The history is provided by the patient. No language interpreter was used.   HPI Comments:  Marcus Gill is a 33 y.o. male who presents to the Emergency Department complaining of severe upper dental pain that began four days ago. He reports associated left-sided facial and ear pain. He has been applying Goody Powder to the area and rinsing with warm, salty water with minimal relief of the pain. Eating makes the pain worse. Pt does not report any alleviating factors. Denies fever, chills, nausea or vomiting. He does not have a dentist.  History reviewed. No pertinent past medical history. History reviewed. No pertinent past surgical history. History reviewed. No pertinent family history. History  Substance Use Topics  . Smoking status: Current Every Day Smoker -- 1.00 packs/day for 15 years    Types: Cigarettes  . Smokeless tobacco: Never Used  . Alcohol Use: No    Review of Systems  Constitutional: Negative for fever and chills.  HENT: Positive for dental problem and ear pain. Negative for facial swelling, sore throat and trouble swallowing.   Respiratory: Negative for cough, shortness of breath and wheezing.   Gastrointestinal: Negative for nausea and vomiting.  Musculoskeletal: Negative for neck pain and neck stiffness.  Skin: Negative for rash.  Neurological: Negative for dizziness, weakness and numbness.  Hematological: Does not bruise/bleed easily.  All other systems reviewed and are negative.   Allergies  Darvocet  Home Medications   Prior to Admission medications   Medication Sig Start Date End Date Taking? Authorizing  Provider  Aspirin-Acetaminophen-Caffeine (GOODY HEADACHE PO) Take 1 packet by mouth daily as needed (for pain).   Yes Historical Provider, MD   Triage Vitals: BP 146/86  Pulse 72  Temp(Src) 98.2 F (36.8 C) (Oral)  Resp 16  Ht 6' (1.829 m)  Wt 175 lb (79.379 kg)  BMI 23.73 kg/m2  SpO2 100% Physical Exam  Nursing note and vitals reviewed. Constitutional: He is oriented to person, place, and time. He appears well-developed and well-nourished.  HENT:  Head: Normocephalic and atraumatic.  Right Ear: Tympanic membrane and ear canal normal.  Left Ear: Tympanic membrane and ear canal normal.  Mouth/Throat: Uvula is midline, oropharynx is clear and moist and mucous membranes are normal. No trismus in the jaw. Dental caries present. No dental abscesses or uvula swelling.  Dental carries and tender to palpation of left upper second molar.  Eyes: EOM are normal.  Neck: Normal range of motion.  Cardiovascular: Normal rate, regular rhythm and normal heart sounds.  Exam reveals no gallop and no friction rub.   No murmur heard. Pulmonary/Chest: Effort normal and breath sounds normal. No respiratory distress. He has no wheezes. He has no rales.  Musculoskeletal: Normal range of motion.  Neurological: He is alert and oriented to person, place, and time.  Skin: Skin is warm and dry.  Psychiatric: He has a normal mood and affect. His behavior is normal.    ED Course  Procedures (including critical care time) DIAGNOSTIC STUDIES: Oxygen Saturation is 100% on RA, normal by my interpretation.   COORDINATION OF CARE: 2:53 PM- Will prescribe antibiotic and pain medication. Will  refer to dentist. Pt verbalizes understanding and agrees to plan.  Medications - No data to display  Labs Review Labs Reviewed - No data to display  Imaging Review No results found.   EKG Interpretation None      MDM   Final diagnoses:  Pain, dental    Pt is well appearing, non-toxic.  No concerning sx's for  infection to deep structures of the neck or floor of the mouth.  Dental referral info given.  Pt agrees to arrange f/u.  I personally performed the services described in this documentation, which was scribed in my presence. The recorded information has been reviewed and is accurate.    Jakub Debold L. Trisha Mangleriplett, PA-C 03/23/14 2138

## 2014-03-21 NOTE — ED Notes (Signed)
Dental pain upper L posterior.  States he'd had it before, but it went away.  Has now returned.

## 2014-03-21 NOTE — ED Notes (Signed)
Patient with no complaints at this time. Respirations even and unlabored. Skin warm/dry. Discharge instructions reviewed with patient at this time. Patient given opportunity to voice concerns/ask questions. Patient discharged at this time and left Emergency Department with steady gait.   

## 2014-03-26 NOTE — ED Provider Notes (Signed)
Medical screening examination/treatment/procedure(s) were performed by non-physician practitioner and as supervising physician I was immediately available for consultation/collaboration.   EKG Interpretation None       Kaide Gage, MD 03/26/14 1846 

## 2015-05-15 ENCOUNTER — Emergency Department (HOSPITAL_COMMUNITY)
Admission: EM | Admit: 2015-05-15 | Discharge: 2015-05-15 | Disposition: A | Discharge status: Home / Self Care | Payer: Self-pay | Attending: Emergency Medicine | Admitting: Emergency Medicine

## 2015-05-15 ENCOUNTER — Encounter (HOSPITAL_COMMUNITY): Payer: Self-pay | Admitting: Emergency Medicine

## 2015-05-15 DIAGNOSIS — K029 Dental caries, unspecified: Secondary | ICD-10-CM | POA: Insufficient documentation

## 2015-05-15 DIAGNOSIS — F17210 Nicotine dependence, cigarettes, uncomplicated: Secondary | ICD-10-CM | POA: Insufficient documentation

## 2015-05-15 DIAGNOSIS — K0889 Other specified disorders of teeth and supporting structures: Secondary | ICD-10-CM | POA: Insufficient documentation

## 2015-05-15 MED ORDER — AMOXICILLIN 500 MG PO CAPS: 500.0000 mg | ORAL_CAPSULE | Freq: Three times a day (TID) | ORAL | Status: DC

## 2015-05-15 MED ORDER — TRAMADOL HCL 50 MG PO TABS: 50.0000 mg | ORAL_TABLET | Freq: Four times a day (QID) | ORAL | Status: DC | PRN

## 2015-05-15 MED ORDER — TRAMADOL HCL 50 MG PO TABS
50.0000 mg | ORAL_TABLET | Freq: Once | ORAL | Status: AC
  Administered 2015-05-15: 50 mg via ORAL
  Filled 2015-05-15: qty 1

## 2015-05-15 MED ORDER — AMOXICILLIN 250 MG PO CAPS
500.0000 mg | ORAL_CAPSULE | Freq: Once | ORAL | Status: AC
  Administered 2015-05-15: 500 mg via ORAL
  Filled 2015-05-15: qty 2

## 2015-05-15 NOTE — ED Provider Notes (Signed)
CSN: [id number]     Arrival date & time 05/15/15  1255 History  By signing my name below, I, Marcus Gill, attest that this documentation has been prepared under the direction and in the presence of Pauline Aus, PA-C Electronically Signed: Soijett Gill, ED Scribe. 05/15/2015. 2:12 PM.   Chief Complaint  Patient presents with  . Dental Pain      The history is provided by the patient. No language interpreter was used.    Marcus Gill is a 34 y.o. male who presents to the Emergency Department complaining of moderate right lower dental pain onset 4 days with associated facial swelling x 3 days. He notes that he was supposed to have the affected right lower tooth pulled but he wasn't able to due to the dentist having a medical emergency. His right lower dental pain is worsened with chewing and air and it radiates to his right ear and right sided head. He states that he is having associated symptoms of right lower facial swelling and HA. He states that has tried tylenol and goody powders with no relief for his symptoms. He denies gum swelling/bleeding, trouble swallowing, fever, chills, and any other symptoms. Pt denies allergies to any medications. He reports that he smokes cigarettes.     History reviewed. No pertinent past medical history. History reviewed. No pertinent past surgical history. History reviewed. No pertinent family history. Social History  Substance Use Topics  . Smoking status: Current Every Day Smoker -- 1.00 packs/day for 15 years    Types: Cigarettes  . Smokeless tobacco: Never Used  . Alcohol Use: No    Review of Systems  Constitutional: Negative for fever and chills.  HENT: Positive for dental problem and facial swelling. Negative for trouble swallowing.   Skin: Negative for color change and wound.  Neurological: Positive for headaches.     Allergies  Darvocet  Home Medications   Prior to Admission medications   Medication Sig Start Date End Date  Taking? Authorizing Provider  acetaminophen (TYLENOL) 500 MG tablet Take 1,000 mg by mouth every 6 (six) hours as needed.   Yes Historical Provider, MD   BP 131/88 mmHg  Pulse 102  Temp(Src) 97.4 F (36.3 C) (Oral)  Resp 14  Ht  (1.803 m)  Wt 173 lb (78.472 kg)  BMI 24.14 kg/m2  SpO2 100% Physical Exam  Constitutional: He is oriented to person, place, and time. He appears well-developed and well-nourished. No distress.  HENT:  Head: Normocephalic and atraumatic.  Right Ear: Tympanic membrane, external ear and ear canal normal.  Left Ear: Tympanic membrane, external ear and ear canal normal.  Mouth/Throat: Uvula is midline, oropharynx is clear and moist and mucous membranes are normal. No trismus in the jaw. Dental caries present. No dental abscesses.  Tenderness and dental caries right sided 1st molar. Mild edema and erythema of the surrounding gingiva without obvious abscess.   Eyes: EOM are normal.  Neck: Neck supple.  Cardiovascular: Normal rate, regular rhythm and normal heart sounds.  Exam reveals no gallop and no friction rub.   No murmur heard. Pulmonary/Chest: Effort normal and breath sounds normal. No respiratory distress. He has no wheezes. He has no rales.  Musculoskeletal: Normal range of motion.  Neurological: He is alert and oriented to person, place, and time.  Skin: Skin is warm and dry.  Psychiatric: He has a normal mood and affect. His behavior is normal.  Nursing note and vitals reviewed.   ED Course  Procedures (  including critical care time) DIAGNOSTIC STUDIES: Oxygen Saturation is 100% on RA, nl by my interpretation.    COORDINATION OF CARE: 2:11 PM Discussed treatment plan with pt at bedside which includes pain medication and f/u with dentist appointment and pt agreed to plan.    Labs Review Labs Reviewed - No data to display  Imaging Review No results found.     MDM   Final diagnoses:  Pain, dental   Pt well appearing, airway  patent.  No concerning sx's for Ludwig's angina.  Pt agrees to amoxil and ultram for pain.    I personally performed the services described in this documentation, which was scribed in my presence. The recorded information has been reviewed and is accurate.    Pauline Ausammy Boniface Goffe, PA-C 05/18/15 2208  Vanetta MuldersScott Zackowski, MD 05/26/15 [phone]

## 2015-05-15 NOTE — ED Notes (Signed)
Patient c/o right lower dental pain x4 days. Slight swelling noted to right lower jaw. Per patient taking tylenol and goody powders with no relief.

## 2015-06-25 ENCOUNTER — Emergency Department (HOSPITAL_COMMUNITY)
Admission: EM | Admit: 2015-06-25 | Discharge: 2015-06-25 | Disposition: A | Discharge status: Home / Self Care | Payer: Self-pay | Attending: Emergency Medicine | Admitting: Emergency Medicine

## 2015-06-25 ENCOUNTER — Emergency Department (HOSPITAL_COMMUNITY): Payer: Self-pay

## 2015-06-25 ENCOUNTER — Encounter (HOSPITAL_COMMUNITY): Payer: Self-pay | Admitting: Emergency Medicine

## 2015-06-25 DIAGNOSIS — F1010 Alcohol abuse, uncomplicated: Secondary | ICD-10-CM | POA: Insufficient documentation

## 2015-06-25 DIAGNOSIS — Y9389 Activity, other specified: Secondary | ICD-10-CM | POA: Insufficient documentation

## 2015-06-25 DIAGNOSIS — Y998 Other external cause status: Secondary | ICD-10-CM | POA: Insufficient documentation

## 2015-06-25 DIAGNOSIS — Z23 Encounter for immunization: Secondary | ICD-10-CM | POA: Insufficient documentation

## 2015-06-25 DIAGNOSIS — S40211A Abrasion of right shoulder, initial encounter: Secondary | ICD-10-CM | POA: Insufficient documentation

## 2015-06-25 DIAGNOSIS — Y9289 Other specified places as the place of occurrence of the external cause: Secondary | ICD-10-CM | POA: Insufficient documentation

## 2015-06-25 DIAGNOSIS — W01198A Fall on same level from slipping, tripping and stumbling with subsequent striking against other object, initial encounter: Secondary | ICD-10-CM | POA: Insufficient documentation

## 2015-06-25 DIAGNOSIS — S42001A Fracture of unspecified part of right clavicle, initial encounter for closed fracture: Secondary | ICD-10-CM

## 2015-06-25 DIAGNOSIS — S42031A Displaced fracture of lateral end of right clavicle, initial encounter for closed fracture: Secondary | ICD-10-CM | POA: Insufficient documentation

## 2015-06-25 DIAGNOSIS — S0081XA Abrasion of other part of head, initial encounter: Secondary | ICD-10-CM | POA: Insufficient documentation

## 2015-06-25 DIAGNOSIS — F17210 Nicotine dependence, cigarettes, uncomplicated: Secondary | ICD-10-CM | POA: Insufficient documentation

## 2015-06-25 DIAGNOSIS — S0990XA Unspecified injury of head, initial encounter: Secondary | ICD-10-CM | POA: Insufficient documentation

## 2015-06-25 MED ORDER — HYDROCODONE-ACETAMINOPHEN 5-325 MG PO TABS: 1.0000 | ORAL_TABLET | ORAL | Status: DC | PRN

## 2015-06-25 MED ORDER — TETANUS-DIPHTH-ACELL PERTUSSIS 5-2.5-18.5 LF-MCG/0.5 IM SUSP
0.5000 mL | Freq: Once | INTRAMUSCULAR | Status: AC
  Administered 2015-06-25: 0.5 mL via INTRAMUSCULAR
  Filled 2015-06-25: qty 0.5
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
## 2015-06-25 NOTE — ED Provider Notes (Signed)
CSN: [id number]     Arrival date & time 06/25/15  1048 History  By signing my name below, I, Freida Busman, attest that this documentation has been prepared under the direction and in the presence of Glynn Octave, MD . Electronically Signed: Freida Busman, Scribe. 06/25/2015. 11:52 AM.   Chief Complaint  Patient presents with  . Fall    The history is provided by the patient. No language interpreter was used.   HPI Comments:  Marcus Gill is a 35 y.o. male who presents to the Emergency Department s/p fall last night ~ 2300 complaining of constant moderate right shoulder pain following the incident. Pt was intoxicated at the time, states he was stumbling indoors when fell forward and struck head on the ground but admits he does not recall total events.  He reports associated HA. He denies LOC, vomiting, CP, abdominal pain, and vision change. No alleviating factors noted. Tetanus was last updated ~ 7 years ago     History reviewed. No pertinent past medical history. History reviewed. No pertinent past surgical history. History reviewed. No pertinent family history. Social History  Substance Use Topics  . Smoking status: Current Every Day Smoker -- 1.00 packs/day for 15 years    Types: Cigarettes  . Smokeless tobacco: Never Used  . Alcohol Use: Yes     Comment: occassional    Review of Systems  Eyes: Negative for visual disturbance.  Cardiovascular: Negative for chest pain.  Gastrointestinal: Negative for vomiting and abdominal pain.  Musculoskeletal: Positive for arthralgias.  Neurological: Positive for headaches. Negative for syncope.  All other systems reviewed and are negative.  Allergies  Darvocet  Home Medications   Prior to Admission medications   Medication Sig Start Date End Date Taking? Authorizing Provider  amoxicillin (AMOXIL) 500 MG capsule Take 1 capsule (500 mg total) by mouth 3 (three) times daily. For 10 days Patient not taking: Reported on 06/25/2015  05/15/15   Tammy Triplett, PA-C  HYDROcodone-acetaminophen (NORCO/VICODIN) 5-325 MG tablet Take 1 tablet by mouth every 4 (four) hours as needed. 06/25/15   Glynn Octave, MD  traMADol (ULTRAM) 50 MG tablet Take 1 tablet (50 mg total) by mouth every 6 (six) hours as needed. Patient not taking: Reported on 06/25/2015 05/15/15   Tammy Triplett, PA-C   BP 120/78 mmHg  Pulse 78  Temp(Src) 97.7 F (36.5 C) (Oral)  Resp 14  Ht  (1.803 m)  Wt 175 lb (79.379 kg)  BMI 24.42 kg/m2  SpO2 100% Physical Exam  Constitutional: He is oriented to person, place, and time. He appears well-developed and well-nourished. No distress.  HENT:  Head: Normocephalic and atraumatic.  Mouth/Throat: Oropharynx is clear and moist. No oropharyngeal exudate.  Eyes: Conjunctivae and EOM are normal. Pupils are equal, round, and reactive to light.  Neck: Normal range of motion. Neck supple.  No meningismus.  Cardiovascular: Normal rate, regular rhythm, normal heart sounds and intact distal pulses.   No murmur heard. Pulmonary/Chest: Effort normal and breath sounds normal. No respiratory distress.  Abdominal: Soft. There is no tenderness. There is no rebound and no guarding.  Musculoskeletal: Normal range of motion. He exhibits tenderness.  tenderness and swelling over right AC joint  Intact radial pulses No c-spine tenderness   Neurological: He is alert and oriented to person, place, and time. No cranial nerve deficit. He exhibits normal muscle tone. Coordination normal.  No ataxia on finger to nose bilaterally. No pronator drift. 5/5 strength throughout. CN 2-12 intact.Equal grip strength.  Sensation intact.   Skin: Skin is warm.  Abrasion noted to right lateral shoulder and right forehead   Psychiatric: He has a normal mood and affect. His behavior is normal.  Nursing note and vitals reviewed.   ED Course  Procedures   DIAGNOSTIC STUDIES:  Oxygen Saturation is 98% on RA, normal by my interpretation.     COORDINATION OF CARE:  11:45 AM Will order imaging and update tetanus. Discussed treatment plan with pt at bedside and pt agreed to plan.  Labs Review Labs Reviewed - No data to display  Imaging Review Dg Clavicle Right  06/25/2015  CLINICAL DATA:  35 year old male with right shoulder pain after falling EXAM: RIGHT CLAVICLE - 2+ VIEWS COMPARISON:  Concurrently obtained radiographs of the right shoulder FINDINGS: Acute minimally displaced fracture through the distal aspect of the clavicle. The acromioclavicular joint remains congruent. The visualized chest is unremarkable. IMPRESSION: Acute minimally displaced distal clavicle fracture. Electronically Signed   By: Malachy Moan M.D.   On: 06/25/2015 13:03   Dg Shoulder Right  06/25/2015  CLINICAL DATA:  Right shoulder pain status post fall. EXAM: RIGHT SHOULDER - 2+ VIEW COMPARISON:  06/25/2015, clavicular radiograph. FINDINGS: There is a transverse fracture of the distal right clavicle, 17 mm from the acromioclavicular joint, with half shaft width superior displacement of the distal fracture fragment. There is no evidence of right shoulder fracture. The glenohumeral joint is normally located. There is soft tissue swelling overlying the distal clavicle. The visualized portion of the right lung is clear. IMPRESSION: Transverse minimally displaced fracture of the distal right clavicle. Electronically Signed   By: Ted Mcalpine M.D.   On: 06/25/2015 13:07   Ct Head Wo Contrast  06/25/2015  CLINICAL DATA:  Status post fall with headache. EXAM: CT HEAD WITHOUT CONTRAST TECHNIQUE: Contiguous axial images were obtained from the base of the skull through the vertex without intravenous contrast. COMPARISON:  None. FINDINGS: Brain: No evidence of acute infarction, hemorrhage, extra-axial collection, ventriculomegaly, or mass effect. Vascular: No hyperdense vessel or unexpected calcification. Skull: Negative for fracture or focal lesion.  Sinuses/Orbits: Mild polypoid mucosal thickening of the ethmoid sinuses. Other: Right frontal scalp hematoma. IMPRESSION: No acute intracranial abnormality. Right subcutaneous frontal hematoma. Electronically Signed   By: Ted Mcalpine M.D.   On: 06/25/2015 13:19   I have personally reviewed and evaluated these images as part of my medical decision-making.    MDM   Final diagnoses:  Right clavicle fracture, closed, initial encounter  Head injury, initial encounter   patient was intoxicated last night and fell face forward onto of cement floor. He complains of right shoulder pain and right frontal headache. No vomiting. No blood thinner use. Nonfocal neurological exam.  Exam suspicious for clavicle fracture. Neurovascularly intact.  X-ray shows right distal clavicle fracture. Neurovascular intact. Strong radial pulse. CT head is negative. Narcotic database reviewed. Patient with no recent narcotic prescriptions.  He does have a legitimate reason for pain medication. We'll give short supply, follow-up with orthopedics. Head injury precautions given. Return precautions discussed.  I personally performed the services described in this documentation, which was scribed in my presence. The recorded information has been reviewed and is accurate.    Glynn Octave, MD 06/25/15 [phone]

## 2015-06-25 NOTE — Discharge Instructions (Signed)
Clavicle Fracture Follow up with Dr. Romeo Apple.  Return to the ED if you develop new or worsening symptoms. The clavicle, also called the collarbone, is the long bone that connects your shoulder to your rib cage. You can feel your collarbone at the top of your shoulders and rib cage. A clavicle fracture is a broken clavicle. It is a common injury that can happen at any age.  CAUSES Common causes of a clavicle fracture include:  A direct blow to your shoulder.  A car accident.  A fall, especially if you try to break your fall with an outstretched arm. RISK FACTORS You may be at increased risk if:  You are younger than 25 years or older than 75 years. Most clavicle fractures happen to people who are younger than 25 years.  You are a male.  You play contact sports. SIGNS AND SYMPTOMS A fractured clavicle is painful. It also makes it hard to move your arm. Other signs and symptoms may include:  A shoulder that drops downward and forward.  Pain when trying to lift your shoulder.  Bruising, swelling, and tenderness over your clavicle.  A grinding noise when you try to move your shoulder.  A bump over your clavicle. DIAGNOSIS Your health care provider can usually diagnose a clavicle fracture by asking about your injury and examining your shoulder and clavicle. He or she may take an X-ray to determine the position of your clavicle. TREATMENT Treatment depends on the position of your clavicle after the fracture:  If the broken ends of the bone are not out of place, your health care provider may put your arm in a sling or wrap a support bandage around your chest (figure-of-eight wrap).  If the broken ends of the bone are out of place, you may need surgery. Surgery may involve placing screws, pins, or plates to keep your clavicle stable while it heals. Healing may take about 3 months. When your health care provider thinks your fracture has healed enough, you may have to do physical therapy  to regain normal movement and build up your arm strength. HOME CARE INSTRUCTIONS   Apply ice to the injured area:  Put ice in a plastic bag.  Place a towel between your skin and the bag.  Leave the ice on for 20 minutes, 2-3 times a day.  If you have a wrap or splint:  Wear it all the time, and remove it only to take a bath or shower.  When you bathe or shower, keep your shoulder in the same position as when the sling or wrap is on.  Do not lift your arm.  If you have a figure-of-eight wrap:  Another person must tighten it every day.  It should be tight enough to hold your shoulders back.  Allow enough room to place your index finger between your body and the strap.  Loosen the wrap immediately if you feel numbness or tingling in your hands.  Only take medicines as directed by your health care provider.  Avoid activities that make the injury or pain worse for 4-6 weeks after surgery.  Keep all follow-up appointments. SEEK MEDICAL CARE IF:  Your medicine is not helping to relieve pain and swelling. SEEK IMMEDIATE MEDICAL CARE IF:  Your arm is numb, cold, or pale, even when the splint is loose. MAKE SURE YOU:   Understand these instructions.  Will watch your condition.  Will get help right away if you are not doing well or get worse.  This information is not intended to replace advice given to you by your health care provider. Make sure you discuss any questions you have with your health care provider.   Document Released: 02/28/2005 Document Revised: 05/26/2013 Document Reviewed: 04/13/2013 Elsevier Interactive Patient Education 2016 [employer].    Concussion, Adult A concussion, or closed-head injury, is a brain injury caused by a direct blow to the head or by a quick and sudden movement (jolt) of the head or neck. Concussions are usually not life-threatening. Even so, the effects of a concussion can be serious. If you have had a concussion before, you are  more likely to experience concussion-like symptoms after a direct blow to the head.  CAUSES  Direct blow to the head, such as from running into another player during a soccer game, being hit in a fight, or hitting your head on a hard surface.  A jolt of the head or neck that causes the brain to move back and forth inside the skull, such as in a car crash. SIGNS AND SYMPTOMS The signs of a concussion can be hard to notice. Early on, they may be missed by you, family members, and health care providers. You may look fine but act or feel differently. Symptoms are usually temporary, but they may last for days, weeks, or even longer. Some symptoms may appear right away while others may not show up for hours or days. Every head injury is different. Symptoms include:  Mild to moderate headaches that will not go away.  A feeling of pressure inside your head.  Having more trouble than usual:  Learning or remembering things you have heard.  Answering questions.  Paying attention or concentrating.  Organizing daily tasks.  Making decisions and solving problems.  Slowness in thinking, acting or reacting, speaking, or reading.  Getting lost or being easily confused.  Feeling tired all the time or lacking energy (fatigued).  Feeling drowsy.  Sleep disturbances.  Sleeping more than usual.  Sleeping less than usual.  Trouble falling asleep.  Trouble sleeping (insomnia).  Loss of balance or feeling lightheaded or dizzy.  Nausea or vomiting.  Numbness or tingling.  Increased sensitivity to:  Sounds.  Lights.  Distractions.  Vision problems or eyes that tire easily.  Diminished sense of taste or smell.  Ringing in the ears.  Mood changes such as feeling sad or anxious.  Becoming easily irritated or angry for little or no reason.  Lack of motivation.  Seeing or hearing things other people do not see or hear (hallucinations). DIAGNOSIS Your health care provider can  usually diagnose a concussion based on a description of your injury and symptoms. He or she will ask whether you passed out (lost consciousness) and whether you are having trouble remembering events that happened right before and during your injury. Your evaluation might include:  A brain scan to look for signs of injury to the brain. Even if the test shows no injury, you may still have a concussion.  Blood tests to be sure other problems are not present. TREATMENT  Concussions are usually treated in an emergency department, in urgent care, or at a clinic. You may need to stay in the hospital overnight for further treatment.  Tell your health care provider if you are taking any medicines, including prescription medicines, over-the-counter medicines, and natural remedies. Some medicines, such as blood thinners (anticoagulants) and aspirin, may increase the chance of complications. Also tell your health care provider whether you have had alcohol or  are taking illegal drugs. This information may affect treatment.  Your health care provider will send you home with important instructions to follow.  How fast you will recover from a concussion depends on many factors. These factors include how severe your concussion is, what part of your brain was injured, your age, and how healthy you were before the concussion.  Most people with mild injuries recover fully. Recovery can take time. In general, recovery is slower in older persons. Also, persons who have had a concussion in the past or have other medical problems may find that it takes longer to recover from their current injury. HOME CARE INSTRUCTIONS General Instructions  Carefully follow the directions your health care provider gave you.  Only take over-the-counter or prescription medicines for pain, discomfort, or fever as directed by your health care provider.  Take only those medicines that your health care provider has approved.  Do not  drink alcohol until your health care provider says you are well enough to do so. Alcohol and certain other drugs may slow your recovery and can put you at risk of further injury.  If it is harder than usual to remember things, write them down.  If you are easily distracted, try to do one thing at a time. For example, do not try to watch TV while fixing dinner.  Talk with family members or close friends when making important decisions.  Keep all follow-up appointments. Repeated evaluation of your symptoms is recommended for your recovery.  Watch your symptoms and tell others to do the same. Complications sometimes occur after a concussion. Older adults with a brain injury may have a higher risk of serious complications, such as a blood clot on the brain.  Tell your teachers, school nurse, school counselor, coach, athletic trainer, or work Production designer, theatre/television/film about your injury, symptoms, and restrictions. Tell them about what you can or cannot do. They should watch for:  Increased problems with attention or concentration.  Increased difficulty remembering or learning new information.  Increased time needed to complete tasks or assignments.  Increased irritability or decreased ability to cope with stress.  Increased symptoms.  Rest. Rest helps the brain to heal. Make sure you:  Get plenty of sleep at night. Avoid staying up late at night.  Keep the same bedtime hours on weekends and weekdays.  Rest during the day. Take daytime naps or rest breaks when you feel tired.  Limit activities that require a lot of thought or concentration. These include:  Doing homework or job-related work.  Watching TV.  Working on the computer.  Avoid any situation where there is potential for another head injury (football, hockey, soccer, basketball, martial arts, downhill snow sports and horseback riding). Your condition will get worse every time you experience a concussion. You should avoid these activities  until you are evaluated by the appropriate follow-up health care providers. Returning To Your Regular Activities You will need to return to your normal activities slowly, not all at once. You must give your body and brain enough time for recovery.  Do not return to sports or other athletic activities until your health care provider tells you it is safe to do so.  Ask your health care provider when you can drive, ride a bicycle, or operate heavy machinery. Your ability to react may be slower after a brain injury. Never do these activities if you are dizzy.  Ask your health care provider about when you can return to work or school. Preventing Another  Concussion It is very important to avoid another brain injury, especially before you have recovered. In rare cases, another injury can lead to permanent brain damage, brain swelling, or death. The risk of this is greatest during the first 7-10 days after a head injury. Avoid injuries by:  Wearing a seat belt when riding in a car.  Drinking alcohol only in moderation.  Wearing a helmet when biking, skiing, skateboarding, skating, or doing similar activities.  Avoiding activities that could lead to a second concussion, such as contact or recreational sports, until your health care provider says it is okay.  Taking safety measures in your home.  Remove clutter and tripping hazards from floors and stairways.  Use grab bars in bathrooms and handrails by stairs.  Place non-slip mats on floors and in bathtubs.  Improve lighting in dim areas. SEEK MEDICAL CARE IF:  You have increased problems paying attention or concentrating.  You have increased difficulty remembering or learning new information.  You need more time to complete tasks or assignments than before.  You have increased irritability or decreased ability to cope with stress.  You have more symptoms than before. Seek medical care if you have any of the following symptoms for more  than 2 weeks after your injury:  Lasting (chronic) headaches.  Dizziness or balance problems.  Nausea.  Vision problems.  Increased sensitivity to noise or light.  Depression or mood swings.  Anxiety or irritability.  Memory problems.  Difficulty concentrating or paying attention.  Sleep problems.  Feeling tired all the time. SEEK IMMEDIATE MEDICAL CARE IF:  You have severe or worsening headaches. These may be a sign of a blood clot in the brain.  You have weakness (even if only in one hand, leg, or part of the face).  You have numbness.  You have decreased coordination.  You vomit repeatedly.  You have increased sleepiness.  One pupil is larger than the other.  You have convulsions.  You have slurred speech.  You have increased confusion. This may be a sign of a blood clot in the brain.  You have increased restlessness, agitation, or irritability.  You are unable to recognize people or places.  You have neck pain.  It is difficult to wake you up.  You have unusual behavior changes.  You lose consciousness. MAKE SURE YOU:  Understand these instructions.  Will watch your condition.  Will get help right away if you are not doing well or get worse.   This information is not intended to replace advice given to you by your health care provider. Make sure you discuss any questions you have with your health care provider.   Document Released: 08/11/2003 Document Revised: 06/11/2014 Document Reviewed: 12/11/2012 Elsevier Interactive Patient Education [employer]

## 2015-07-02 ENCOUNTER — Emergency Department (HOSPITAL_COMMUNITY)
Admission: EM | Admit: 2015-07-02 | Discharge: 2015-07-02 | Disposition: A | Discharge status: Home / Self Care | Payer: Self-pay | Attending: Emergency Medicine | Admitting: Emergency Medicine

## 2015-07-02 ENCOUNTER — Encounter (HOSPITAL_COMMUNITY): Payer: Self-pay | Admitting: Emergency Medicine

## 2015-07-02 DIAGNOSIS — S42001D Fracture of unspecified part of right clavicle, subsequent encounter for fracture with routine healing: Secondary | ICD-10-CM | POA: Insufficient documentation

## 2015-07-02 DIAGNOSIS — F17210 Nicotine dependence, cigarettes, uncomplicated: Secondary | ICD-10-CM | POA: Insufficient documentation

## 2015-07-02 DIAGNOSIS — W1839XD Other fall on same level, subsequent encounter: Secondary | ICD-10-CM | POA: Insufficient documentation

## 2015-07-02 HISTORY — DX: Fracture of unspecified part of unspecified clavicle, initial encounter for closed fracture: S42.009A

## 2015-07-02 MED ORDER — HYDROCODONE-ACETAMINOPHEN 5-325 MG PO TABS: 1.0000 | ORAL_TABLET | ORAL | Status: DC | PRN

## 2015-07-02 MED ORDER — IBUPROFEN 800 MG PO TABS
800.0000 mg | ORAL_TABLET | Freq: Once | ORAL | Status: AC
  Administered 2015-07-02: 800 mg via ORAL

## 2015-07-02 MED ORDER — HYDROCODONE-ACETAMINOPHEN 5-325 MG PO TABS
1.0000 | ORAL_TABLET | Freq: Once | ORAL | Status: AC
  Administered 2015-07-02: 1 via ORAL

## 2015-07-02 MED ORDER — IBUPROFEN 800 MG PO TABS
ORAL_TABLET | ORAL | Status: AC
  Filled 2015-07-02: qty 1

## 2015-07-02 MED ORDER — HYDROCODONE-ACETAMINOPHEN 5-325 MG PO TABS
1.0000 | ORAL_TABLET | Freq: Once | ORAL | Status: DC
  Filled 2015-07-02: qty 1

## 2015-07-02 NOTE — ED Notes (Signed)
Patient c/o right clavicle pain in which he was seen last Saturday in ER for and diagnosed with clavicle fracture. Patient unable to follow-up with orthopedic surgeon due to up front charge. Patient c/o extreme pain in which he is unable to sleep.

## 2015-07-02 NOTE — ED Provider Notes (Signed)
CSN: [id number]     Arrival date & time 07/02/15  1712 History   First MD Initiated Contact with Patient 07/02/15 1739     Chief Complaint  Patient presents with  . Clavicle Injury     (Consider location/radiation/quality/duration/timing/severity/associated sxs/prior Treatment) The history is provided by the patient and a significant other.   Marcus Gill is a 35 y.o. male presenting for assistance with his right clavicle pain.  He was seen here one week ago for a fall in which he sustained a right clavicle fracture.  He has persistent pain since running out of his hydrocodone.  He has attempted to followup with orthopedics but unfortunately has no job or insurance and therefore cannot afford to do this.  He denies numbness of his right arm or hand.  Denies radiation of pain which is constant and worsened with any attempt at movement although has maintained the sling he was given.      Past Medical History  Diagnosis Date  . Clavicle fracture    No past surgical history on file. No family history on file. Social History  Substance Use Topics  . Smoking status: Current Every Day Smoker -- 1.00 packs/day for 15 years    Types: Cigarettes  . Smokeless tobacco: Never Used  . Alcohol Use: Yes     Comment: occassional    Review of Systems  Constitutional: Negative for fever.  Musculoskeletal: Positive for arthralgias. Negative for myalgias and joint swelling.  Skin: Positive for color change.  Neurological: Negative for weakness and numbness.      Allergies  Darvocet and Ultram  Home Medications   Prior to Admission medications   Medication Sig Start Date End Date Taking? Authorizing Provider  amoxicillin (AMOXIL) 500 MG capsule Take 1 capsule (500 mg total) by mouth 3 (three) times daily. For 10 days Patient not taking: Reported on 06/25/2015 05/15/15   Tammy Triplett, PA-C  HYDROcodone-acetaminophen (NORCO/VICODIN) 5-325 MG tablet Take 1 tablet by mouth every 4 (four)  hours as needed for severe pain. 07/02/15   Burgess Amor, PA-C  traMADol (ULTRAM) 50 MG tablet Take 1 tablet (50 mg total) by mouth every 6 (six) hours as needed. Patient not taking: Reported on 06/25/2015 05/15/15   Tammy Triplett, PA-C   BP 138/88 mmHg  Pulse 83  Temp(Src) 98.6 F (37 C) (Temporal)  Resp 18  Ht  (1.803 m)  Wt 79.379 kg  BMI 24.42 kg/m2  SpO2 100% Physical Exam  Constitutional: He appears well-developed and well-nourished.  HENT:  Head: Atraumatic.  Neck: Normal range of motion.  Cardiovascular:  Pulses:      Radial pulses are 2+ on the right side, and 2+ on the left side.  Pulses equal bilaterally  Musculoskeletal: He exhibits tenderness.  ttp right distal clavicle. No deformity.  Old fading bruise noted at site.   Neurological: He is alert. He has normal strength. No sensory deficit.  Skin: Skin is warm and dry.  Psychiatric: He has a normal mood and affect.    ED Course  Procedures (including critical care time) Labs Review Labs Reviewed - No data to display  Imaging Review No results found. I have personally reviewed and evaluated these images and lab results as part of my medical decision-making.   EKG Interpretation None      MDM   Final diagnoses:  Clavicle fracture, right, with routine healing, subsequent encounter    Pt was given information about [facility] indigent program for assistance getting in  with ortho.  He was switched to a sling and swathe for better pain control.  Hydrocodone,  Ice/heat f/u with ortho as planned.    Burgess Amor, PA-C 07/02/15 2208  Raeford Razor, MD 07/03/15 [phone]

## 2015-07-02 NOTE — Discharge Instructions (Signed)
Continue using your ibuprofen as discussed,  Taking 3 tablets  (600 mg) every 8 hours with a meal or snack.  You may take the hydrocodone prescribed for pain relief.  This will make you drowsy - do not drive within 4 hours of taking this medication.  Plan to followup with Dr. Romeo Apple.

## 2015-07-06 ENCOUNTER — Telehealth: Payer: Self-pay | Admitting: Orthopedic Surgery

## 2015-07-11 ENCOUNTER — Encounter: Payer: Self-pay | Admitting: Orthopedic Surgery

## 2015-07-11 ENCOUNTER — Ambulatory Visit (INDEPENDENT_AMBULATORY_CARE_PROVIDER_SITE_OTHER): Payer: Self-pay | Admitting: Orthopedic Surgery

## 2015-07-11 VITALS — BP 153/104 | HR 74 | Ht 71.0 in | Wt 175.0 lb

## 2015-07-11 DIAGNOSIS — S42001A Fracture of unspecified part of right clavicle, initial encounter for closed fracture: Secondary | ICD-10-CM

## 2015-07-11 MED ORDER — HYDROCODONE-ACETAMINOPHEN 7.5-325 MG PO TABS: 1.0000 | ORAL_TABLET | ORAL | Status: DC | PRN

## 2015-07-11 NOTE — Progress Notes (Signed)
Patient ID: Marcus Gill, male   DOB: 18-Apr-1981, 35 y.o.   MRN: [id number]  Chief Complaint  Patient presents with  . Hospitalization Follow-up    Colar bone pain DOI 06/24/2015    HPI Marcus Gill is a 35 y.o. male.  Fell inj right shoulder  Left hand dominant  First office visit February 6  Date of injury 06/24/2015  Symptoms pain, swelling, stiffness tingling Sharp throbbing stabbing aching Counts 9/10 X-rays were done at the hospital Medicine hydrocodone 5 mg not controlling pain Pain increases with daily activities and trying to move his arm  Review of Systems Review of Systems  HENT: Positive for dental problem.   Musculoskeletal: Positive for back pain.  Allergic/Immunologic: Positive for environmental allergies.    Past Medical History  Diagnosis Date  . Clavicle fracture     No history of any surgery    Social History Social History  Substance Use Topics  . Smoking status: Current Every Day Smoker -- 1.00 packs/day for 15 years    Types: Cigarettes  . Smokeless tobacco: Never Used  . Alcohol Use: Yes     Comment: occassional    Allergies  Allergen Reactions  . Darvocet [Propoxyphene N-Acetaminophen] Hives  . Ultram [Tramadol Hcl] Nausea And Vomiting    Current Outpatient Prescriptions  Medication Sig Dispense Refill  . HYDROcodone-acetaminophen (NORCO/VICODIN) 5-325 MG tablet Take 1 tablet by mouth every 4 (four) hours as needed for severe pain. 30 tablet 0  . HYDROcodone-acetaminophen (NORCO) 7.5-325 MG tablet Take 1 tablet by mouth every 4 (four) hours as needed for moderate pain. 126 tablet 0   No current facility-administered medications for this visit.       Physical Exam Physical Exam Blood pressure 153/104, pulse 74, height  (1.803 m), weight 175 lb (79.379 kg). Appearance, there are no abnormalities in terms of appearance the patient was well-developed and well-nourished. The grooming and hygiene were normal.  Mental  status orientation, there was normal alertness and orientation Mood pleasant Ambulatory status normal with no assistive devices  Examination of the right shoulder Inspection tenderness without prominence of the distal clavicle tenderness over the distal clavicle Range of motion hand wrist and elbow normal shoulder limited by pain and 3 weeks of immobility Tests for stability AC joint cannot be tested Motor strength  hand wrist normal Skin warm dry and intact without laceration or ulceration or erythema Neurologic examination normal sensation Vascular examination normal pulses with warm extremity and normal capillary refill  The opposite extremity normal appearance    Data Reviewed Imaging studies from the hospital 3 of the shoulder and 3 of the distal clavicle nondisplaced distal clavicle fracture stable medial ligaments  Assessment  Distal clavicle fracture stable   Plan  Start range of motion exercises Follow-up 3 weeks x-ray please review prior x-rays for best views

## 2015-07-11 NOTE — Patient Instructions (Addendum)
Start home exercises, remove sling as tolerated, take medication as prescribed    Shoulder Range of Motion Exercises Shoulder range of motion (ROM) exercises are designed to keep the shoulder moving freely. They are often recommended for people who have shoulder pain. MOVEMENT EXERCISE When you are able, do this exercise 5-6 days per week, or as told by your health care provider. Work toward doing 2 sets of 10 swings. Pendulum Exercise How To Do This Exercise Lying Down 1. Lie face-down on a bed with your abdomen close to the side of the bed. 2. Let your arm hang over the side of the bed. 3. Relax your shoulder, arm, and hand. 4. Slowly and gently swing your arm forward and back. Do not use your neck muscles to swing your arm. They should be relaxed. If you are struggling to swing your arm, have someone gently swing it for you. When you do this exercise for the first time, swing your arm at a 15 degree angle for 15 seconds, or swing your arm 10 times. As pain lessens over time, increase the angle of the swing to 30-45 degrees. 5. Repeat steps 1-4 with the other arm. How To Do This Exercise While Standing 1. Stand next to a sturdy chair or table and hold on to it with your hand.  Bend forward at the waist.  Bend your knees slightly.  Relax your other arm and let it hang limp.  Relax the shoulder blade of the arm that is hanging and let it drop.  While keeping your shoulder relaxed, use body motion to swing your arm in small circles. The first time you do this exercise, swing your arm for about 30 seconds or 10 times. When you do it next time, swing your arm for a little longer.  Stand up tall and relax.  Repeat steps 1-7, this time changing the direction of the circles. 2. Repeat steps 1-8 with the other arm. STRETCHING EXERCISES Do these exercises 3-4 times per day on 5-6 days per week or as told by your health care provider. Work toward holding the stretch for 20  seconds. Stretching Exercise 1 1. Lift your arm straight out in front of you. 2. Bend your arm 90 degrees at the elbow (right angle) so your forearm goes across your body and looks like the letter "L." 3. Use your other arm to gently pull the elbow forward and across your body. 4. Repeat steps 1-3 with the other arm. Stretching Exercise 2 You will need a towel or rope for this exercise. 1. Bend one arm behind your back with the palm facing outward. 2. Hold a towel with your other hand. 3. Reach the arm that holds the towel above your head, and bend that arm at the elbow. Your wrist should be behind your neck. 4. Use your free hand to grab the free end of the towel. 5. With the higher hand, gently pull the towel up behind you. 6. With the lower hand, pull the towel down behind you. 7. Repeat steps 1-6 with the other arm. STRENGTHENING EXERCISES Do each of these exercises at four different times of day (sessions) every day or as told by your health care provider. To begin with, repeat each exercise 5 times (repetitions). Work toward doing 3 sets of 12 repetitions or as told by your health care provider. Strengthening Exercise 1 You will need a light weight for this activity. As you grow stronger, you may use a heavier weight. 1. Standing  with a weight in your hand, lift your arm straight out to the side until it is at the same height as your shoulder. 2. Bend your arm at 90 degrees so that your fingers are pointing to the ceiling. 3. Slowly raise your hand until your arm is straight up in the air. 4. Repeat steps 1-3 with the other arm. Strengthening Exercise 2 You will need a light weight for this activity. As you grow stronger, you may use a heavier weight. 1. Standing with a weight in your hand, gradually move your straight arm in an arc, starting at your side, then out in front of you, then straight up over your head. 2. Gradually move your other arm in an arc, starting at your side, then  out in front of you, then straight up over your head. 3. Repeat steps 1-2 with the other arm. Strengthening Exercise 3 You will need an elastic band for this activity. As you grow stronger, gradually increase the size of the bands or increase the number of bands that you use at one time. 1. While standing, hold an elastic band in one hand and raise that arm up in the air. 2. With your other hand, pull down the band until that hand is by your side. 3. Repeat steps 1-2 with the other arm.   This information is not intended to replace advice given to you by your health care provider. Make sure you discuss any questions you have with your health care provider.   Document Released: 02/17/2003 Document Revised: 10/05/2014 Document Reviewed: 05/17/2014 Elsevier Interactive Patient Education [employer]

## 2015-08-01 ENCOUNTER — Ambulatory Visit: Payer: Self-pay | Admitting: Orthopedic Surgery

## 2015-08-16 ENCOUNTER — Ambulatory Visit: Payer: Self-pay | Admitting: Orthopedic Surgery

## 2016-10-11 ENCOUNTER — Emergency Department (HOSPITAL_COMMUNITY)
Admission: EM | Admit: 2016-10-11 | Discharge: 2016-10-11 | Disposition: A | Discharge status: Home / Self Care | Payer: Self-pay | Attending: Dermatology | Admitting: Dermatology

## 2016-10-11 ENCOUNTER — Encounter (HOSPITAL_COMMUNITY): Payer: Self-pay | Admitting: *Deleted

## 2016-10-11 DIAGNOSIS — Z5321 Procedure and treatment not carried out due to patient leaving prior to being seen by health care provider: Secondary | ICD-10-CM | POA: Insufficient documentation

## 2016-10-11 DIAGNOSIS — R109 Unspecified abdominal pain: Secondary | ICD-10-CM | POA: Insufficient documentation

## 2016-10-11 DIAGNOSIS — F17210 Nicotine dependence, cigarettes, uncomplicated: Secondary | ICD-10-CM | POA: Insufficient documentation

## 2016-10-11 LAB — URINALYSIS, ROUTINE W REFLEX MICROSCOPIC
BACTERIA UA: NONE SEEN
Bilirubin Urine: NEGATIVE
Glucose, UA: NEGATIVE mg/dL
Hgb urine dipstick: NEGATIVE
Ketones, ur: 5 mg/dL — AB
NITRITE: NEGATIVE
PH: 6 (ref 5.0–8.0)
Protein, ur: 30 mg/dL — AB
SPECIFIC GRAVITY, URINE: 1.028 (ref 1.005–1.030)

## 2016-10-11 NOTE — ED Triage Notes (Signed)
Pt c/o right side flank pain that started x 3 days ago; pt states he drinks energy drinks everyday and states he has only been to urinate about 4 times today

## 2016-10-11 NOTE — ED Triage Notes (Signed)
Denies hx of kidney stones or blood in urine

## 2016-10-11 NOTE — ED Notes (Signed)
Called x 1 no answer

## 2016-10-11 NOTE — ED Triage Notes (Signed)
Registration notified this nurse that pt left and unable to wait any longer, has to work at [id number].

## 2017-05-18 IMAGING — CT CT HEAD W/O CM
1 series · 16 of 30 positions shown, 20 images · non-contrast
Comparison: None.

CLINICAL DATA: Status post fall with headache.

EXAM:
CT HEAD WITHOUT CONTRAST
TECHNIQUE: Contiguous axial images were obtained from the base of the skull
through the vertex without intravenous contrast.

[Series 2: headtrauma 4.8 h37s · axial · 0.53mm/px · z∈[+163,+326]mm · 16 of 36 slices shown, 20 images]
[im 2/36  brain]
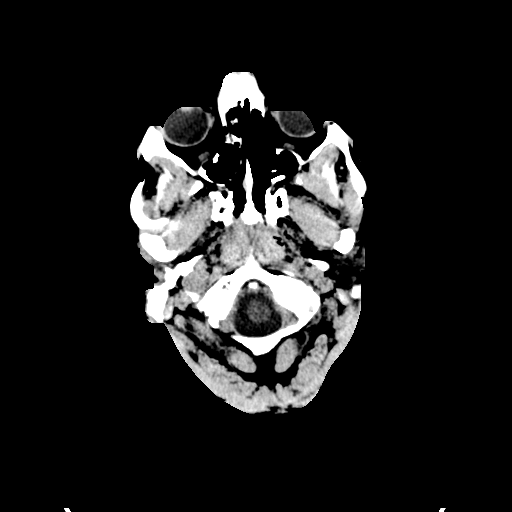
[im 2/36  bone]
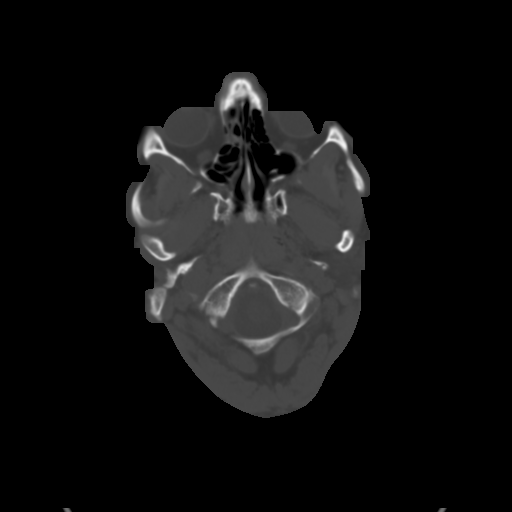
[im 4/36  brain]
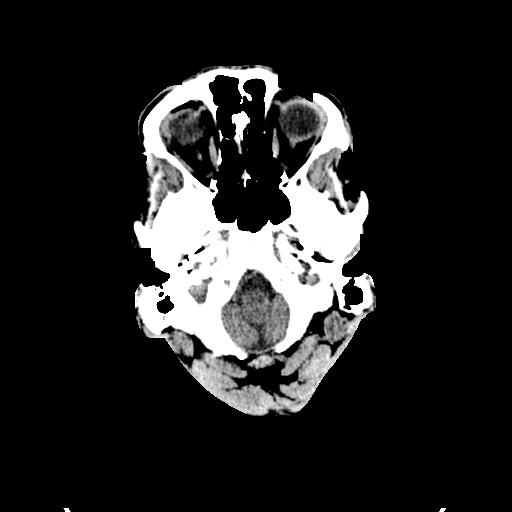
[im 7/36  brain]
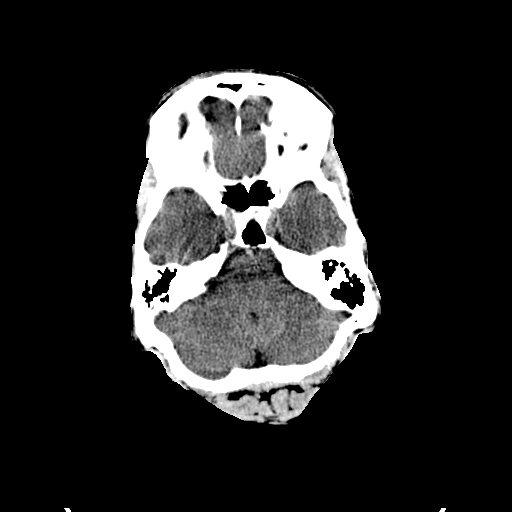
[im 9/36  brain]
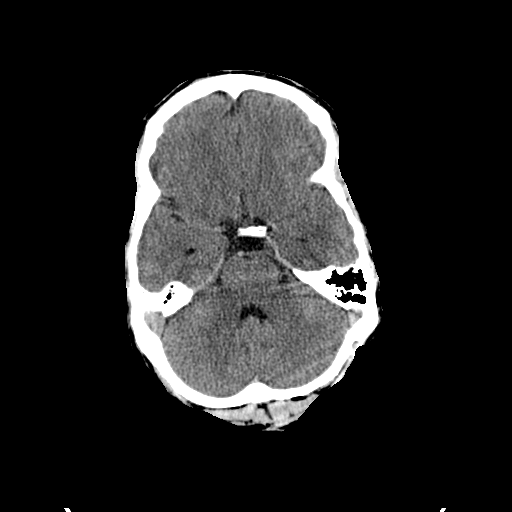
[im 10/36  brain]
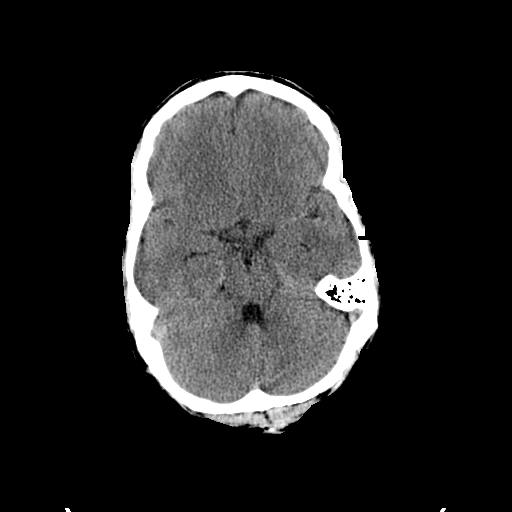
[im 10/36  bone]
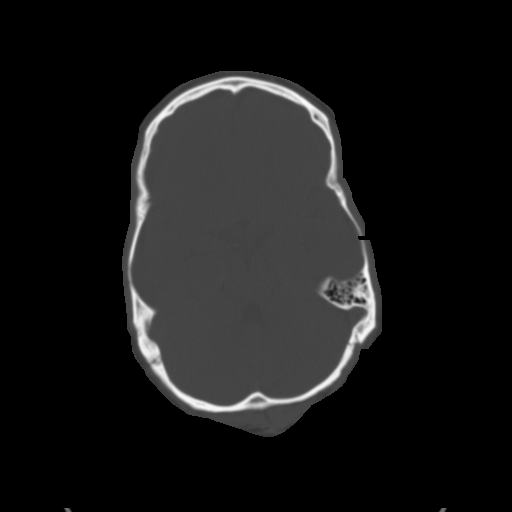
[im 13/36  brain]
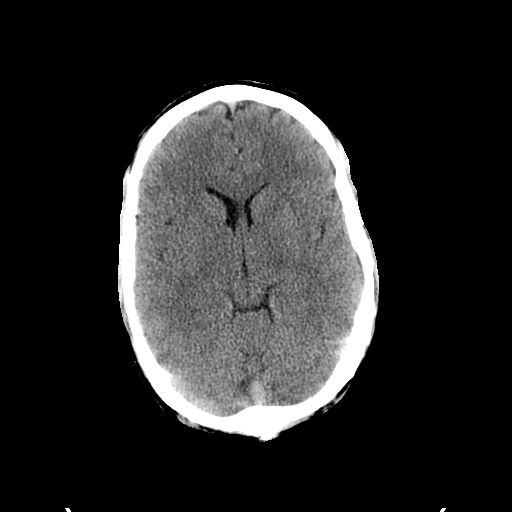
[im 15/36  brain]
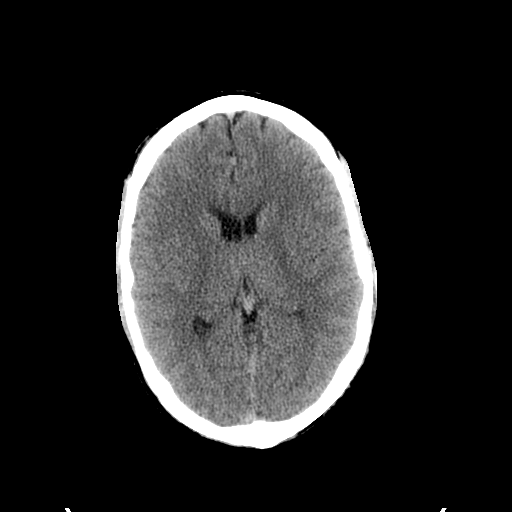
[im 17/36  brain]
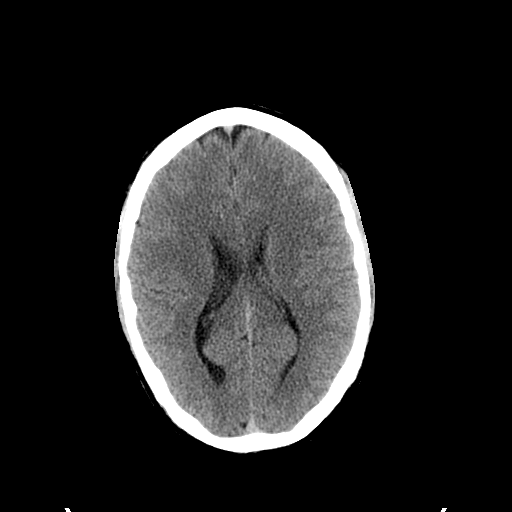
[im 19/36  brain]
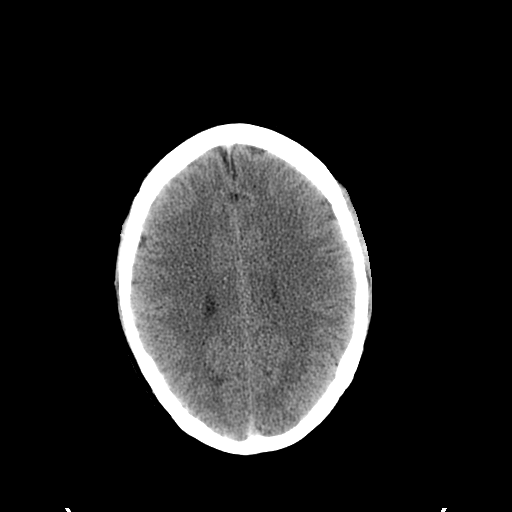
[im 19/36  bone]
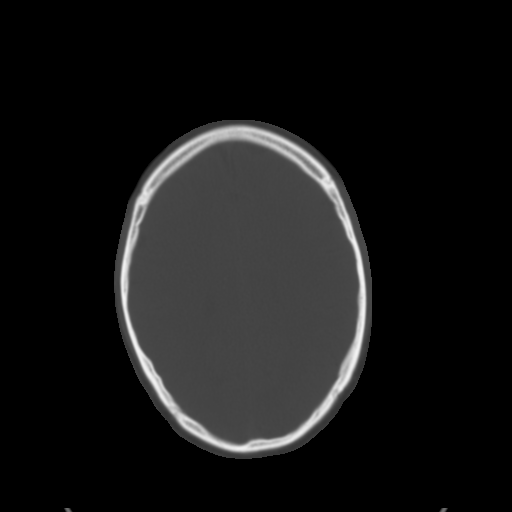
[im 21/36  brain]
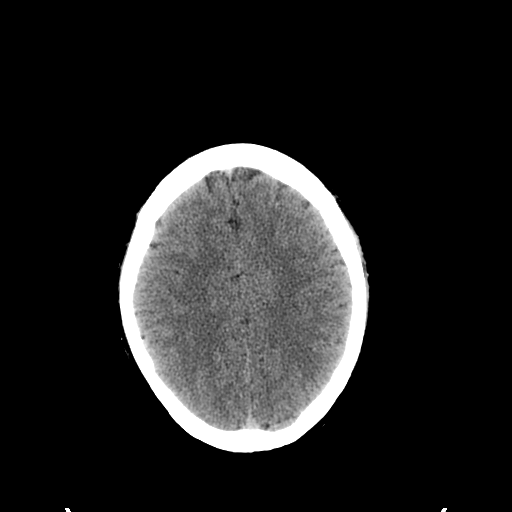
[im 23/36  brain]
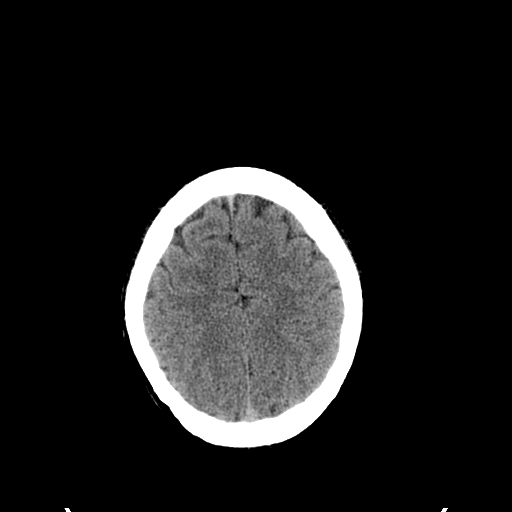
[im 26/36  brain]
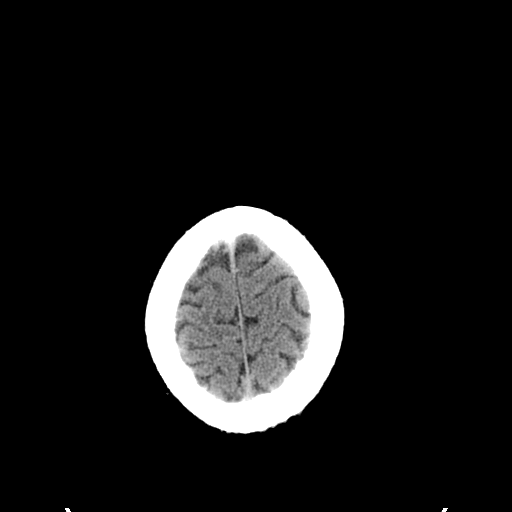
[im 27/36  brain]
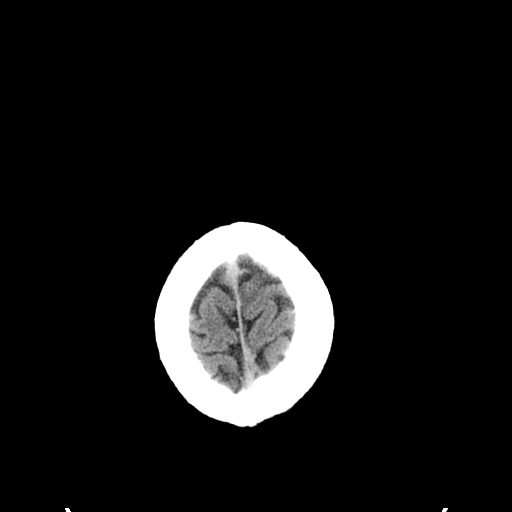
[im 27/36  bone]
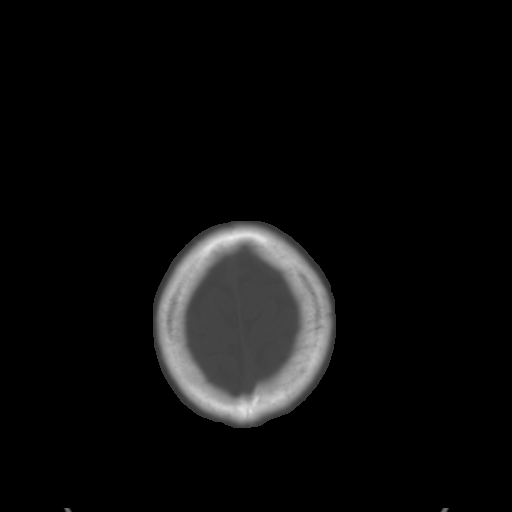
[im 29/36  brain]
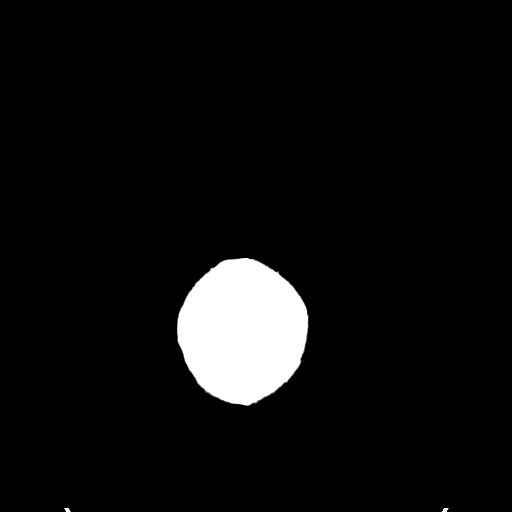
[im 32/36  brain]
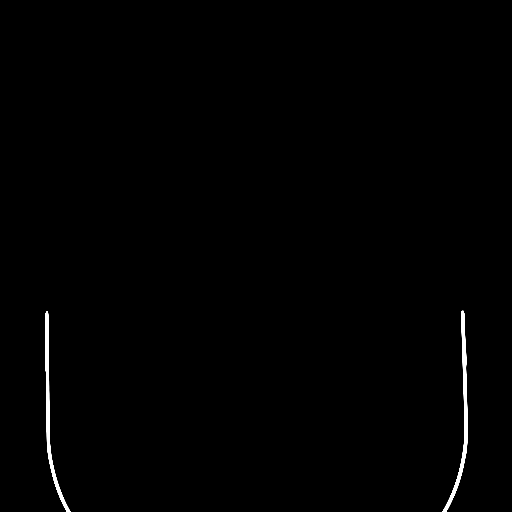
[im 34/36  brain]
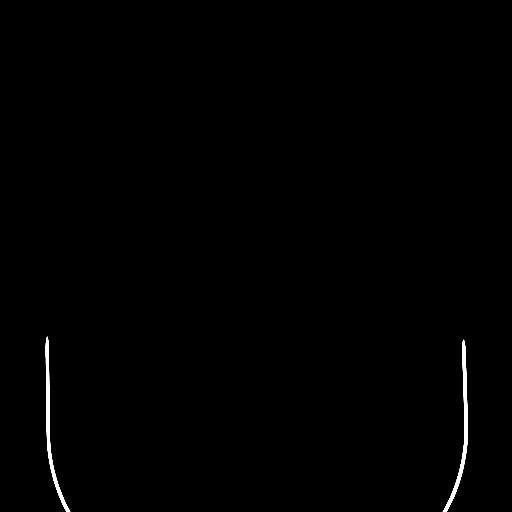

[16 of 30 positions shown; findings below may reference images not displayed]

FINDINGS: Brain: No evidence of acute infarction, hemorrhage, extra-axial
collection, ventriculomegaly, or mass effect.

Vascular: No hyperdense vessel or unexpected calcification.

Skull: Negative for fracture or focal lesion.

Sinuses/Orbits: Mild polypoid mucosal thickening of the ethmoid
sinuses.

Other: Right frontal scalp hematoma.
IMPRESSION: No acute intracranial abnormality.

Right subcutaneous frontal hematoma.

## 2018-04-19 ENCOUNTER — Inpatient Hospital Stay (HOSPITAL_COMMUNITY)
Admission: AD | Admit: 2018-04-19 | Discharge: 2018-04-23 | DRG: 897 | Disposition: A | Discharge status: Home / Self Care | Payer: Federal, State, Local not specified - Other | Source: Intra-hospital | Attending: Psychiatry | Admitting: Psychiatry

## 2018-04-19 ENCOUNTER — Encounter (HOSPITAL_COMMUNITY): Payer: Self-pay | Admitting: Emergency Medicine

## 2018-04-19 ENCOUNTER — Other Ambulatory Visit: Payer: Self-pay

## 2018-04-19 ENCOUNTER — Emergency Department (HOSPITAL_COMMUNITY)
Admission: EM | Admit: 2018-04-19 | Discharge: 2018-04-19 | Disposition: A | Discharge status: Other Acute Inpatient Hospital | Payer: Self-pay | Attending: Emergency Medicine | Admitting: Emergency Medicine

## 2018-04-19 DIAGNOSIS — F17210 Nicotine dependence, cigarettes, uncomplicated: Secondary | ICD-10-CM | POA: Insufficient documentation

## 2018-04-19 DIAGNOSIS — Z598 Other problems related to housing and economic circumstances: Secondary | ICD-10-CM | POA: Diagnosis not present

## 2018-04-19 DIAGNOSIS — F1220 Cannabis dependence, uncomplicated: Secondary | ICD-10-CM | POA: Diagnosis present

## 2018-04-19 DIAGNOSIS — F322 Major depressive disorder, single episode, severe without psychotic features: Secondary | ICD-10-CM | POA: Insufficient documentation

## 2018-04-19 DIAGNOSIS — Z23 Encounter for immunization: Secondary | ICD-10-CM

## 2018-04-19 DIAGNOSIS — F1123 Opioid dependence with withdrawal: Secondary | ICD-10-CM | POA: Diagnosis present

## 2018-04-19 DIAGNOSIS — G4700 Insomnia, unspecified: Secondary | ICD-10-CM | POA: Diagnosis present

## 2018-04-19 DIAGNOSIS — Z886 Allergy status to analgesic agent status: Secondary | ICD-10-CM | POA: Diagnosis not present

## 2018-04-19 DIAGNOSIS — F1994 Other psychoactive substance use, unspecified with psychoactive substance-induced mood disorder: Secondary | ICD-10-CM | POA: Diagnosis not present

## 2018-04-19 DIAGNOSIS — R45851 Suicidal ideations: Secondary | ICD-10-CM | POA: Diagnosis present

## 2018-04-19 DIAGNOSIS — F1124 Opioid dependence with opioid-induced mood disorder: Secondary | ICD-10-CM | POA: Diagnosis present

## 2018-04-19 DIAGNOSIS — F1120 Opioid dependence, uncomplicated: Secondary | ICD-10-CM | POA: Insufficient documentation

## 2018-04-19 DIAGNOSIS — F1320 Sedative, hypnotic or anxiolytic dependence, uncomplicated: Secondary | ICD-10-CM | POA: Diagnosis present

## 2018-04-19 DIAGNOSIS — F332 Major depressive disorder, recurrent severe without psychotic features: Secondary | ICD-10-CM | POA: Diagnosis present

## 2018-04-19 DIAGNOSIS — Z885 Allergy status to narcotic agent status: Secondary | ICD-10-CM | POA: Diagnosis not present

## 2018-04-19 DIAGNOSIS — Z560 Unemployment, unspecified: Secondary | ICD-10-CM | POA: Diagnosis not present

## 2018-04-19 DIAGNOSIS — F1910 Other psychoactive substance abuse, uncomplicated: Secondary | ICD-10-CM | POA: Insufficient documentation

## 2018-04-19 DIAGNOSIS — F1193 Opioid use, unspecified with withdrawal: Secondary | ICD-10-CM

## 2018-04-19 LAB — CBC
HCT: 49.9 % (ref 39.0–52.0)
HEMOGLOBIN: 16.7 g/dL (ref 13.0–17.0)
MCH: 29.8 pg (ref 26.0–34.0)
MCHC: 33.5 g/dL (ref 30.0–36.0)
MCV: 89.1 fL (ref 80.0–100.0)
Platelets: 297 10*3/uL (ref 150–400)
RBC: 5.6 MIL/uL (ref 4.22–5.81)
RDW: 13.1 % (ref 11.5–15.5)
WBC: 7.3 10*3/uL (ref 4.0–10.5)
nRBC: 0 % (ref 0.0–0.2)

## 2018-04-19 LAB — COMPREHENSIVE METABOLIC PANEL
ALK PHOS: 58 U/L (ref 38–126)
ALT: 16 U/L (ref 0–44)
AST: 16 U/L (ref 15–41)
Albumin: 4.6 g/dL (ref 3.5–5.0)
Anion gap: 10 (ref 5–15)
BUN: 9 mg/dL (ref 6–20)
CALCIUM: 9.5 mg/dL (ref 8.9–10.3)
CO2: 28 mmol/L (ref 22–32)
CREATININE: 0.83 mg/dL (ref 0.61–1.24)
Chloride: 102 mmol/L (ref 98–111)
GFR calc non Af Amer: >60 mL/min (ref >60)
GLUCOSE: 94 mg/dL (ref 70–99)
Potassium: 3.3 mmol/L — ABNORMAL LOW (ref 3.5–5.1)
SODIUM: 140 mmol/L (ref 135–145)
Total Bilirubin: 0.8 mg/dL (ref 0.3–1.2)
Total Protein: 8.4 g/dL — ABNORMAL HIGH (ref 6.5–8.1)

## 2018-04-19 LAB — ETHANOL: Alcohol, Ethyl (B): <10 mg/dL (ref <10)

## 2018-04-19 LAB — SALICYLATE LEVEL: Salicylate Lvl: <7 mg/dL (ref 2.8–30.0)

## 2018-04-19 LAB — ACETAMINOPHEN LEVEL: Acetaminophen (Tylenol), Serum: <10 ug/mL — ABNORMAL LOW (ref 10–30)

## 2018-04-19 LAB — RAPID URINE DRUG SCREEN, HOSP PERFORMED
AMPHETAMINES: NOT DETECTED
BARBITURATES: NOT DETECTED
Benzodiazepines: POSITIVE — AB
Cocaine: NOT DETECTED
Opiates: POSITIVE — AB
TETRAHYDROCANNABINOL: POSITIVE — AB

## 2018-04-19 MED ORDER — TRAZODONE HCL 50 MG PO TABS
50.0000 mg | ORAL_TABLET | Freq: Every evening | ORAL | Status: DC | PRN
  Administered 2018-04-19 – 2018-04-22 (×4): 50 mg via ORAL
  Filled 2018-04-19 (×5): qty 1

## 2018-04-19 MED ORDER — LORAZEPAM 1 MG PO TABS
0.0000 mg | ORAL_TABLET | Freq: Four times a day (QID) | ORAL | Status: DC
  Administered 2018-04-19: 1 mg via ORAL
  Filled 2018-04-19: qty 1

## 2018-04-19 MED ORDER — ONDANSETRON 4 MG PO TBDP: 4.0000 mg | ORAL_TABLET | Freq: Four times a day (QID) | ORAL | Status: DC | PRN

## 2018-04-19 MED ORDER — LORAZEPAM 1 MG PO TABS
1.0000 mg | ORAL_TABLET | Freq: Once | ORAL | Status: AC | PRN
  Administered 2018-04-19: 1 mg via ORAL
  Filled 2018-04-19: qty 1

## 2018-04-19 MED ORDER — THIAMINE HCL 100 MG/ML IJ SOLN: 100.0000 mg | Freq: Every day | INTRAMUSCULAR | Status: DC

## 2018-04-19 MED ORDER — HYDROXYZINE HCL 25 MG PO TABS
25.0000 mg | ORAL_TABLET | Freq: Four times a day (QID) | ORAL | Status: DC | PRN
  Administered 2018-04-20 – 2018-04-22 (×4): 25 mg via ORAL
  Filled 2018-04-19 (×5): qty 1

## 2018-04-19 MED ORDER — ACETAMINOPHEN 325 MG PO TABS
650.0000 mg | ORAL_TABLET | ORAL | Status: DC | PRN
  Administered 2018-04-19 – 2018-04-22 (×11): 650 mg via ORAL
  Filled 2018-04-19 (×10): qty 2

## 2018-04-19 MED ORDER — VITAMIN B-1 100 MG PO TABS
100.0000 mg | ORAL_TABLET | Freq: Every day | ORAL | Status: DC
  Administered 2018-04-19: 100 mg via ORAL
  Filled 2018-04-19: qty 1

## 2018-04-19 MED ORDER — CLONIDINE HCL 0.1 MG PO TABS
0.1000 mg | ORAL_TABLET | Freq: Every day | ORAL | Status: DC
  Filled 2018-04-19 (×2): qty 1

## 2018-04-19 MED ORDER — METHOCARBAMOL 500 MG PO TABS: 500.0000 mg | ORAL_TABLET | Freq: Three times a day (TID) | ORAL | Status: DC | PRN

## 2018-04-19 MED ORDER — ALUM & MAG HYDROXIDE-SIMETH 200-200-20 MG/5ML PO SUSP: 30.0000 mL | ORAL | Status: DC | PRN

## 2018-04-19 MED ORDER — CLONIDINE HCL 0.1 MG PO TABS
0.1000 mg | ORAL_TABLET | Freq: Four times a day (QID) | ORAL | Status: AC
  Administered 2018-04-19 – 2018-04-21 (×9): 0.1 mg via ORAL
  Filled 2018-04-19 (×12): qty 1

## 2018-04-19 MED ORDER — LOPERAMIDE HCL 2 MG PO CAPS
2.0000 mg | ORAL_CAPSULE | ORAL | Status: DC | PRN
  Administered 2018-04-20: 2 mg via ORAL
  Administered 2018-04-20: 4 mg via ORAL
  Filled 2018-04-19: qty 1
  Filled 2018-04-19: qty 2

## 2018-04-19 MED ORDER — LORAZEPAM 1 MG PO TABS: 0.0000 mg | ORAL_TABLET | Freq: Two times a day (BID) | ORAL | Status: DC

## 2018-04-19 MED ORDER — NAPROXEN 500 MG PO TABS
500.0000 mg | ORAL_TABLET | Freq: Two times a day (BID) | ORAL | Status: DC | PRN
  Filled 2018-04-19: qty 1

## 2018-04-19 MED ORDER — DICYCLOMINE HCL 20 MG PO TABS
20.0000 mg | ORAL_TABLET | Freq: Four times a day (QID) | ORAL | Status: DC | PRN
  Administered 2018-04-20: 20 mg via ORAL
  Filled 2018-04-19 (×2): qty 1

## 2018-04-19 MED ORDER — HYDROXYZINE HCL 25 MG PO TABS: 25.0000 mg | ORAL_TABLET | Freq: Three times a day (TID) | ORAL | Status: DC | PRN

## 2018-04-19 MED ORDER — LORAZEPAM 2 MG/ML IJ SOLN: 0.0000 mg | Freq: Four times a day (QID) | INTRAMUSCULAR | Status: DC

## 2018-04-19 MED ORDER — CLONIDINE HCL 0.1 MG PO TABS
0.1000 mg | ORAL_TABLET | ORAL | Status: DC
  Administered 2018-04-22 – 2018-04-23 (×3): 0.1 mg via ORAL
  Filled 2018-04-19 (×5): qty 1

## 2018-04-19 MED ORDER — LORAZEPAM 2 MG/ML IJ SOLN: 0.0000 mg | Freq: Two times a day (BID) | INTRAMUSCULAR | Status: DC

## 2018-04-19 MED ORDER — MAGNESIUM HYDROXIDE 400 MG/5ML PO SUSP: 30.0000 mL | Freq: Every day | ORAL | Status: DC | PRN

## 2018-04-19 MED ORDER — NICOTINE 21 MG/24HR TD PT24
21.0000 mg | MEDICATED_PATCH | Freq: Once | TRANSDERMAL | Status: DC
  Administered 2018-04-19: 21 mg via TRANSDERMAL
  Filled 2018-04-19: qty 1

## 2018-04-19 NOTE — ED Notes (Signed)
TTS in progress at this time.  
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
## 2018-04-19 NOTE — ED Notes (Signed)
[facility], by Allyson SabalBerry NP. Pt can arrive at anytime. Report number is [phone].

## 2018-04-19 NOTE — ED Triage Notes (Addendum)
Patient hands this nurse a note in triage that states "I want to die and I'm begging for help. I'm depressed and a bad drug addict." Patient states "I've been hooked on pain pills for 20 years and I last used today. I also used a bar of xanax today too." Patient states "I would cut myself to end my world." Patient tearful at triage.

## 2018-04-19 NOTE — ED Provider Notes (Addendum)
[facility] EMERGENCY DEPARTMENT Provider Note   CSN: [id number] Arrival date & time: 04/19/18  1705     History   Chief Complaint Chief Complaint  Patient presents with  . V70.1    HPI Marcus Gill is a 37 y.o. male.  HPI Patient presents with substance abuse and suicidal thoughts.  States that he is depressed and wants to die.  States that his thoughts are of hurting himself by slitting his wrist.  States he has not attempted but thinks he could.  Also was abusing opiates.  Used 1 hydrocodone today but states he has been using several 30 mg oxycodone's a day.  Previously has snorted heroin.  Also will use Xanax.  Used 1 bar today but also uses 5 to 10 bar at night.  Denies alcohol.  Denies injection drug use.  States he has a history of depression also.  States he has been using for 20 years.  Over the last year the longest he has been sober is half a day. Past Medical History:  Diagnosis Date  . Clavicle fracture     There are no active problems to display for this patient.   History reviewed. No pertinent surgical history.      Home Medications    Prior to Admission medications   Medication Sig Start Date End Date Taking? Authorizing Provider  acetaminophen (TYLENOL) 500 MG tablet Take 1,000 mg by mouth every 6 (six) hours as needed for mild pain.   Yes [provider]    Family History History reviewed. No pertinent family history.  Social History Social History   Tobacco Use  . Smoking status: Current Every Day Smoker    Packs/day: 1.00    Years: 15.00    Pack years: 15.00    Types: Cigarettes  . Smokeless tobacco: Never Used  Substance Use Topics  . Alcohol use: Yes    Comment: occassional  . Drug use: Yes    Comment: opiates, snort heroin 3 weeks ago     Allergies   Darvocet [propoxyphene n-acetaminophen] and Ultram [tramadol hcl]   Review of Systems Review of Systems  Constitutional: Negative for appetite change and fever.    Respiratory: Negative for shortness of breath.   Cardiovascular: Negative for chest pain.  Gastrointestinal: Negative for abdominal distention, abdominal pain, diarrhea and nausea.  Genitourinary: Negative for flank pain.  Musculoskeletal: Negative for back pain.  Skin: Negative for pallor.  Neurological: Negative for weakness.  Psychiatric/Behavioral: Positive for suicidal ideas. The patient is nervous/anxious.      Physical Exam Updated Vital Signs BP 134/86 (BP Location: Right Arm)   Pulse (!) 125   Temp 98.4 F (36.9 C) (Oral)   Resp 20   Ht 6' (1.829 m)   Wt 66.9 kg   SpO2 100%   BMI 20.00 kg/m   Physical Exam  Constitutional: He appears well-developed.  Eyes: Pupils are equal, round, and reactive to light.  Neck: Neck supple.  Cardiovascular:  Mild tachycardia  Pulmonary/Chest: Effort normal.  Abdominal: There is no tenderness.  Musculoskeletal: He exhibits no tenderness.  Neurological: He is alert.  Skin: Skin is warm. Capillary refill takes less than 2 seconds.     ED Treatments / Results  Labs (all labs ordered are listed, but only abnormal results are displayed) Labs Reviewed  COMPREHENSIVE METABOLIC PANEL - Abnormal; Notable for the following components:      Result Value   Potassium 3.3 (*)    Total Protein 8.4 (*)  All other components within normal limits  ACETAMINOPHEN LEVEL - Abnormal; Notable for the following components:   Acetaminophen (Tylenol), Serum <10 (*)    All other components within normal limits  RAPID URINE DRUG SCREEN, HOSP PERFORMED - Abnormal; Notable for the following components:   Opiates POSITIVE (*)    Benzodiazepines POSITIVE (*)    Tetrahydrocannabinol POSITIVE (*)    All other components within normal limits  ETHANOL  SALICYLATE LEVEL  CBC    EKG None  Radiology No results found.  Procedures Procedures (including critical care time)  Medications Ordered in ED Medications  nicotine (NICODERM CQ - dosed in  mg/24 hours) patch 21 mg (21 mg Transdermal Patch Applied 04/19/18 1833)     Initial Impression / Assessment and Plan / ED Course  I have reviewed the triage vital signs and the nursing notes.  Pertinent labs & imaging results that were available during my care of the patient were reviewed by me and considered in my medical decision making (see chart for details).     Patient with substance abuse and suicidal thoughts.  Medically cleared.  To be seen by TTS.  Abuses benzos and opiates.  Patient has been accepted at behavioral health.  Reportedly patient did tell them however that he is not actually suicidal but was conversed in the making the statements by someone else.  Final Clinical Impressions(s) / ED Diagnoses   Final diagnoses:  Polysubstance abuse [facility])  Suicidal ideation    ED Discharge Orders    None       Benjiman Core, MD 04/19/18 1901    Benjiman Core, MD 04/19/18 2026

## 2018-04-20 ENCOUNTER — Encounter (HOSPITAL_COMMUNITY): Payer: Self-pay

## 2018-04-20 DIAGNOSIS — F1124 Opioid dependence with opioid-induced mood disorder: Principal | ICD-10-CM

## 2018-04-20 DIAGNOSIS — G4700 Insomnia, unspecified: Secondary | ICD-10-CM

## 2018-04-20 DIAGNOSIS — F332 Major depressive disorder, recurrent severe without psychotic features: Secondary | ICD-10-CM

## 2018-04-20 LAB — LIPID PANEL
CHOL/HDL RATIO: 5.6 ratio
Cholesterol: 161 mg/dL (ref 0–200)
HDL: 29 mg/dL — ABNORMAL LOW (ref >40)
LDL Cholesterol: 117 mg/dL — ABNORMAL HIGH (ref 0–99)
TRIGLYCERIDES: 73 mg/dL (ref <150)
VLDL: 15 mg/dL (ref 0–40)

## 2018-04-20 LAB — TSH: TSH: 1.18 u[IU]/mL (ref 0.350–4.500)

## 2018-04-20 MED ORDER — NICOTINE 21 MG/24HR TD PT24
21.0000 mg | MEDICATED_PATCH | Freq: Every day | TRANSDERMAL | Status: DC
  Administered 2018-04-20 – 2018-04-23 (×4): 21 mg via TRANSDERMAL
  Filled 2018-04-20 (×7): qty 1

## 2018-04-20 MED ORDER — PNEUMOCOCCAL VAC POLYVALENT 25 MCG/0.5ML IJ INJ
0.5000 mL | INJECTION | INTRAMUSCULAR | Status: AC
  Administered 2018-04-21: 0.5 mL via INTRAMUSCULAR

## 2018-04-20 MED ORDER — POTASSIUM CHLORIDE CRYS ER 10 MEQ PO TBCR
10.0000 meq | EXTENDED_RELEASE_TABLET | Freq: Two times a day (BID) | ORAL | Status: AC
  Administered 2018-04-20 – 2018-04-21 (×4): 10 meq via ORAL
  Filled 2018-04-20 (×5): qty 1

## 2018-04-20 MED ORDER — INFLUENZA VAC SPLIT QUAD 0.5 ML IM SUSY
0.5000 mL | PREFILLED_SYRINGE | INTRAMUSCULAR | Status: AC
  Administered 2018-04-21: 0.5 mL via INTRAMUSCULAR
  Filled 2018-04-20: qty 0.5

## 2018-04-20 NOTE — Tx Team (Signed)
Initial Treatment Plan 04/20/2018 1:33 AM Marcus BodilySamuel M Sian ZOX:[id number]    PATIENT STRESSORS: Financial difficulties Marital or family conflict Occupational concerns Substance abuse   PATIENT STRENGTHS: Communication skills Physical Health Supportive family/friends   PATIENT IDENTIFIED PROBLEMS: "want to get clean"  "need coping skills"                   DISCHARGE CRITERIA:  Improved stabilization in mood, thinking, and/or behavior Need for constant or close observation no longer present Reduction of life-threatening or endangering symptoms to within safe limits  PRELIMINARY DISCHARGE PLAN: Return to previous living arrangement  PATIENT/FAMILY INVOLVEMENT: This treatment plan has been presented to and reviewed with the patient, Marcus Gill.  The patient and family have been given the opportunity to ask questions and make suggestions.  Edwyna PerfectHeather C Jahi Roza, RN 04/20/2018, 1:33 AM

## 2018-04-20 NOTE — H&P (Signed)
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 Severe recurrent major depression without psychotic features ([place]) [F33.2] 04/19/2018   History of Present Illness:37 year old male , presented to hospital voluntarily reporting depression, suicidal ideations, which he described as passive, endorsing history of opiate dependence . States he has been using Roxicet preferentially, at times more than 10 tablets per day, which he has been crushing and insufflating. Denies IVDA. States he has used Xanax, but not regularly ( less than once a week). Currently endorses depression, which he feels is at least partially substance induced, and states " I am just tired of living this way", " I am tired of having to chase the drug all the time". Denies suicidal ideations at present, and states " I want to live". Endorses some neuro-vegetative symptoms of depression as below, denies anhedonia. Endorses some symptoms of WDL- cramps, nausea, diarrhea, feeling " hot and cold".  Admission UDS positive for cannabis, opiates, BZD. Admission BAL negative.  Associated Signs/Symptoms: Depression Symptoms:  depressed mood, insomnia, suicidal thoughts without plan, loss of energy/fatigue, decreased appetite, (Hypo) Manic Symptoms:  None noted or endorsed Anxiety Symptoms:  Reports increased anxiety, states " I worry a lot" Psychotic Symptoms:  denies PTSD Symptoms: Does not endorse  Total Time spent with patient: 45 minutes  Past Psychiatric History: denies prior psychiatric admissions, denies history of suicide attempts, denies history of self injurious behaviors . Denies history of psychosis. Endorses history of  prior depressive episodes, no clear history of mania or hypomania, denies history of violence . Endorses history of panic attacks, some agoraphobia.  Is the patient at risk to self? Yes.    Has the patient been a risk to self in the past 6 months? No.  Has the patient been a risk to self within the distant past? No.  Is the patient a risk to others? No.  Has the patient been a risk to others in the past 6 months? No.  Has the patient been a risk to others within the distant past? No.   Prior Inpatient Therapy:  no  Prior Outpatient Therapy:  none  Alcohol Screening: 1. How often do you have a drink containing alcohol?: Never 2. How many drinks containing alcohol do you have on a typical day when you are drinking?: 1 or 2 3. How often do you have six or more drinks on one occasion?: Never AUDIT-C Score: 0 9. Have you or someone else been injured as a result of your drinking?: No 10. Has a relative or friend or a doctor or another health worker been concerned about your drinking or suggested you cut down?: No Alcohol Use Disorder Identification Test Final Score (AUDIT): 0 Intervention/Follow-up: AUDIT Score <7 follow-up not indicated Substance Abuse History in the last 12 months: denies alcohol abuse. Reports opiate dependence, states he was taking 10 + Roxicet tablets per day.States " I was crushing and snorting them". Denies IVDA. Reports he was using Xanax once every 1-2 weeks, but not regularly. Reports history of regular Cannabis use. Admission BAL negative, Admission UDS (+) for BZDs, Opiates, Cannabis. Consequences of Substance Abuse: Lost relationships related to substance abuse, financial impact  Previous Psychotropic Medications: was not taking any prescribed medications  prior to admission, states he has not been on psychiatric medications in the past  Psychological Evaluations:  No  Past Medical History: denies medical illnesses  Past Medical History:  Diagnosis Date  .  Clavicle fracture    History reviewed. No pertinent surgical history. Family History: parents alive, separated, no siblings  Family Psychiatric  History: no family psychiatric history , no history of suicides in family, mother has history of alcohol use disorder  Tobacco Screening: smokes 1 PPD  Social History: 5, single, two children, one with patient's mother, the other lives with the mother. Patient lives with father, currently unemployed . Denies legal issues. Social History   Substance and Sexual Activity  Alcohol Use Never  . Frequency: Never     Social History   Substance and Sexual Activity  Drug Use Yes   Comment: opiates, snort heroin 3 weeks ago    Additional Social History:      History of alcohol / drug use?: Yes Longest period of sobriety (when/how long): 6 months Negative Consequences of Use: Personal relationships Withdrawal Symptoms: Diarrhea Name of Substance 1: opiates 1 - Age of First Use: 17  Allergies:   Allergies  Allergen Reactions  . Darvocet [Propoxyphene N-Acetaminophen] Hives  . Ibuprofen   . Ultram [Tramadol Hcl] Nausea And Vomiting   Lab Results:  Results for orders placed or performed during the hospital encounter of 04/19/18 (from the past 48 hour(s))  Comprehensive metabolic panel     Status: Abnormal   Collection Time: 04/19/18  5:37 PM  Result Value Ref Range   Sodium 140 135 - 145 mmol/L   Potassium 3.3 (L) 3.5 - 5.1 mmol/L   Chloride 102 98 - 111 mmol/L   CO2 28 22 - 32 mmol/L   Glucose, Bld 94 70 - 99 mg/dL   BUN 9 6 - 20 mg/dL   Creatinine, Ser 0.83 0.61 - 1.24 mg/dL   Calcium 9.5 8.9 - 10.3 mg/dL   Total Protein 8.4 (H) 6.5 - 8.1 g/dL   Albumin 4.6 3.5 - 5.0 g/dL   AST 16 15 - 41 U/L   ALT 16 0 - 44 U/L   Alkaline Phosphatase 58 38 - 126 U/L   Total Bilirubin 0.8 0.3 - 1.2 mg/dL   GFR calc non Af Amer >60 >60 mL/min   GFR calc Af Amer >60 >60 mL/min    Comment: (NOTE) The eGFR has been calculated using the CKD EPI  equation. This calculation has not been validated in all clinical situations. eGFR's persistently <60 mL/min signify possible Chronic Kidney Disease.    Anion gap 10 5 - 15    Comment: Performed at [facility], [address], [place], [place] [address]  Ethanol     Status: None   Collection Time: 04/19/18  5:37 PM  Result Value Ref Range   Alcohol, Ethyl (B) <10 <10 mg/dL    Comment: (NOTE) Lowest detectable limit for serum alcohol is 10 mg/dL. For medical purposes only. Performed at [facility], [address], [place], [place] [address]   Salicylate level     Status: None   Collection Time: 04/19/18  5:37 PM  Result Value Ref Range   Salicylate Lvl <1.9 2.8 - 30.0 mg/dL    Comment: Performed at [facility], [address] [place], [place] [address]  Acetaminophen level     Status: Abnormal   Collection Time: 04/19/18  5:37 PM  Result Value Ref Range   Acetaminophen (Tylenol), Serum <10 (L)  10 - 30 ug/mL    Comment: (NOTE) Therapeutic concentrations vary significantly. A range of 10-30 ug/mL  may be an effective concentration for many patients. However, some  are best treated at concentrations outside of this range. Acetaminophen concentrations >150 ug/mL at 4 hours after ingestion  and >50 ug/mL at 12 hours after ingestion are often associated with  toxic reactions. Performed at [facility], [address], [place], [place] [address]   cbc     Status: None   Collection Time: 04/19/18  5:37 PM  Result Value Ref Range   WBC 7.3 4.0 - 10.5 K/uL   RBC 5.60 4.22 - 5.81 MIL/uL   Hemoglobin 16.7 13.0 - 17.0 g/dL   HCT 49.9 39.0 - 52.0 %   MCV 89.1 80.0 - 100.0 fL   MCH 29.8 26.0 - 34.0 pg   MCHC 33.5 30.0 - 36.0 g/dL   RDW 13.1 11.5 - 15.5 %   Platelets 297 150 - 400 K/uL   nRBC 0.0 0.0 - 0.2 %    Comment: Performed at [facility], [address], [place], [place] [address]  Rapid urine drug screen (hospital performed)     Status: Abnormal   Collection Time: 04/19/18   5:45 PM  Result Value Ref Range   Opiates POSITIVE (A) NONE DETECTED   Cocaine NONE DETECTED NONE DETECTED   Benzodiazepines POSITIVE (A) NONE DETECTED   Amphetamines NONE DETECTED NONE DETECTED   Tetrahydrocannabinol POSITIVE (A) NONE DETECTED   Barbiturates NONE DETECTED NONE DETECTED    Comment: (NOTE) DRUG SCREEN FOR MEDICAL PURPOSES ONLY.  IF CONFIRMATION IS NEEDED FOR ANY PURPOSE, NOTIFY LAB WITHIN 5 DAYS. LOWEST DETECTABLE LIMITS FOR URINE DRUG SCREEN Drug Class                     Cutoff (ng/mL) Amphetamine and metabolites    1000 Barbiturate and metabolites    200 Benzodiazepine                 166 Tricyclics and metabolites     300 Opiates and metabolites        300 Cocaine and metabolites        300 THC                            50 Performed at [facility], [address] [place],  [address]     Blood Alcohol level:  Lab Results  Component Value Date   ETH <10 60/03/9322    Metabolic Disorder Labs:  No results found for: HGBA1C, MPG No results found for: PROLACTIN No results found for: CHOL, TRIG, HDL, CHOLHDL, VLDL, LDLCALC  Current Medications: Current Facility-Administered Medications  Medication Dose Route Frequency Provider Last Rate Last Dose  . acetaminophen (TYLENOL) tablet 650 mg  650 mg Oral Q4H PRN Lindon Romp A, NP   650 mg at 04/20/18 0831  . alum & mag hydroxide-simeth (MAALOX/MYLANTA) 200-200-20 MG/5ML suspension 30 mL  30 mL Oral Q4H PRN Lindon Romp A, NP      . cloNIDine (CATAPRES) tablet 0.1 mg  0.1 mg Oral QID Lindon Romp A, NP   0.1 mg at 04/20/18 0801   Followed by  . [START ON 04/22/2018] cloNIDine (CATAPRES) tablet 0.1 mg  0.1 mg Oral BH-qamhs Rozetta Nunnery, NP       Followed by  . [START ON 04/24/2018] cloNIDine (CATAPRES) tablet 0.1 mg  0.1 mg Oral QAC  breakfast Lindon Romp A, NP      . dicyclomine (BENTYL) tablet 20 mg  20 mg Oral Q6H PRN Lindon Romp A, NP   20 mg at 04/20/18 0831  . hydrOXYzine (ATARAX/VISTARIL)  tablet 25 mg  25 mg Oral Q6H PRN Lindon Romp A, NP   25 mg at 04/20/18 0831  . [START ON 04/21/2018] Influenza vac split quadrivalent PF (FLUARIX) injection 0.5 mL  0.5 mL Intramuscular Tomorrow-1000 Lindon Romp A, NP      . loperamide (IMODIUM) capsule 2-4 mg  2-4 mg Oral PRN Lindon Romp A, NP   4 mg at 04/20/18 0831  . magnesium hydroxide (MILK OF MAGNESIA) suspension 30 mL  30 mL Oral Daily PRN Lindon Romp A, NP      . methocarbamol (ROBAXIN) tablet 500 mg  500 mg Oral Q8H PRN Lindon Romp A, NP      . naproxen (NAPROSYN) tablet 500 mg  500 mg Oral BID PRN Lindon Romp A, NP      . nicotine (NICODERM CQ - dosed in mg/24 hours) patch 21 mg  21 mg Transdermal Daily Lindon Romp A, NP   21 mg at 04/20/18 0803  . ondansetron (ZOFRAN-ODT) disintegrating tablet 4 mg  4 mg Oral Q6H PRN Rozetta Nunnery, NP      . [START ON 04/21/2018] pneumococcal 23 valent vaccine (PNU-IMMUNE) injection 0.5 mL  0.5 mL Intramuscular Tomorrow-1000 Lindon Romp A, NP      . traZODone (DESYREL) tablet 50 mg  50 mg Oral QHS PRN Rozetta Nunnery, NP   50 mg at 04/19/18 2342   PTA Medications: Medications Prior to Admission  Medication Sig Dispense Refill Last Dose  . acetaminophen (TYLENOL) 500 MG tablet Take 1,000 mg by mouth every 6 (six) hours as needed for mild pain.   04/19/2018 at Unknown time    Musculoskeletal: Strength & Muscle Tone: within normal limits- no tremors, no diaphoresis, no psychomotor restlessness or agitation Gait & Station: normal Patient leans: N/A  Psychiatric Specialty Exam: Physical Exam  Review of Systems  Constitutional: Positive for chills.  HENT: Negative.   Eyes: Negative.   Respiratory: Negative.   Cardiovascular: Negative.   Gastrointestinal: Positive for nausea. Negative for diarrhea and vomiting.       (+) cramping   Genitourinary: Negative.   Musculoskeletal: Positive for myalgias.  Skin: Negative.   Neurological: Negative for seizures and headaches.   Endo/Heme/Allergies: Negative.   Psychiatric/Behavioral: Positive for depression, substance abuse and suicidal ideas.  All other systems reviewed and are negative.   Blood pressure 118/80, pulse 88, temperature (!) 97.3 F (36.3 C), temperature source Oral, resp. rate 18, height 5' 8.9" (1.75 m), weight 63 kg, SpO2 99 %.Body mass index is 20.59 kg/m.  General Appearance: Fairly Groomed  Eye Contact:  Fair  Speech:  Normal Rate  Volume:  Decreased  Mood:  Depressed  Affect:  constricted, vaguely dysphoric  Thought Process:  Linear and Descriptions of Associations: Intact  Orientation:  Full (Time, Place, and Person)  Thought Content:  no hallucinations, no delusions, not internally preoccupied  Suicidal Thoughts:  No denies suicidal or self injurious ideations, denies any homicidal or violent ideations, contracts for safety on unit   Homicidal Thoughts:  No  Memory:  recent and remote grossly intact   Judgement:  Fair  Insight:  Fair  Psychomotor Activity:  Decreased- no current symptoms of BZD WDL- no tremors, no diaphoresis, no restlessness   Concentration:  Concentration: Good and Attention  Span: Good  Recall:  Good  Fund of Knowledge:  Good  Language:  Good  Akathisia:  Negative  Handed:  Right  AIMS (if indicated):     Assets:  Desire for Improvement Social Support  ADL's:  Intact  Cognition:  WNL  Sleep:  Number of Hours: 5.5    Treatment Plan Summary: Daily contact with patient to assess and evaluate symptoms and progress in treatment, Medication management, Plan inpatient treatment  and medications as below  Observation Level/Precautions:  15 minute checks  Laboratory:  as needed   Psychotherapy:  Milieu, group therapy  Medications:  Opiate Detox Protocol with Clonidine. We reviewed starting an antidepressant medication- at this time patient prefers to wait / " see if I feel better once I am detoxed ".   Consultations: as needed    Discharge Concerns:  -   Estimated LOS: 4-5 days   Other:     Physician Treatment Plan for Primary Diagnosis:  Opiate Dependence/ Opiate Withdrawal, Cannabis Dependence Long Term Goal(s): Improvement in symptoms so as ready for discharge  Short Term Goals: Ability to identify changes in lifestyle to reduce recurrence of condition will improve, Ability to maintain clinical measurements within normal limits will improve and Ability to identify triggers associated with substance abuse/mental health issues will improve  Physician Treatment Plan for Secondary Diagnosis: 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    Jenne Campus, MD 11/17/20199:58 AM

## 2018-04-20 NOTE — Progress Notes (Signed)
Patient ID: Marcus Gill, male   DOB: [birth year], 37 y.o.   MRN: [id number]  D. Pt presents with a flat affect and cooperative behavior. Pt reports ongoing symptoms of withdrawal including headache, diarrhea, anxiety and stomach cramping. Reports that he is worried about "feeling bad."  A. Labs and vitals monitored. Pt given and educated on medications. Pt supported emotionally and encouraged to express concerns and ask questions.    R. Pt remains safe with 15 minute checks. Pt currently denies SI/HI and AVH and agrees to contact staff before acting on any harmful thoughts. Will continue POC.
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
## 2018-04-20 NOTE — [facility] Group Notes (Signed)
[facility] LCSW Group Therapy Note  04/20/2018  10:00-11:00AM  Type of Therapy and Topic:  Group Therapy:  Adding Supports Including Being Your Own Support  Participation Level:  Active   Description of Group:  Patients in this group were introduced to the concept that additional supports including self-support are an essential part of recovery.  A song entitled "I Need Help!" was played and a group discussion was held in reaction to the idea of needing to add supports.  A song entitled "My Own Hero" was played and a group discussion ensued in which patients stated they could relate to the song and it inspired them to realize they have be willing to help themselves in order to succeed, because other people cannot achieve sobriety or stability for them.  We discussed adding a variety of healthy supports to address the various needs in their lives.  A song was played called "I Know Where I've Been" toward the end of group and used to conduct an inspirational wrap-up to group of remembering how far they have already come in their journey.  Therapeutic Goals: 1)  demonstrate the importance of being a part of one's own support system 2)  discuss reasons people in one's life may eventually be unable to be continually supportive  3)  identify the patient's current support system and   4)  elicit commitments to add healthy supports and to become more conscious of being self-supportive   Summary of Patient Progress:  The patient expressed that what he really would like to have set up is outpatient treatment at a Suboxone Clinic, saying that is the only way he sees come off opioids safely and staying off it instead of dying.  He said that he can spend his paycheck in 2 hours on opiate pills.  He also shared that it would be difficult for him to find a ride back and forth to [place] from [place] if a suboxone clinic cannot be located in his community.  He asked if [facility] in [place] does suboxone  maintenance, and was told to check with his weekday CSW who either knows that answer or can find out whereas weekend staff cannot.   Therapeutic Modalities:   Motivational Interviewing Activity  Lynnell ChadMareida J Grossman-Orr

## 2018-04-20 NOTE — [facility] Suicide Risk Assessment (Signed)
[facility] INPATIENT:  Family/Significant Other Suicide Prevention Education  Suicide Prevention Education:  Patient Refusal for Family/Significant Other Suicide Prevention Education: The patient Marcus BodilySamuel M Sabater has refused to provide written consent for family/significant other to be provided Family/Significant Other Suicide Prevention Education during admission and/or prior to discharge. SPE was provided to Patient. Literature provided to patient. Physician notified.  Evorn GongRonnie D Johnte Portnoy 04/20/2018, 5:21 PM
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
## 2018-04-20 NOTE — Progress Notes (Signed)
Patient did not attend wrap up group. 

## 2018-04-21 MED ORDER — ADULT MULTIVITAMIN W/MINERALS CH
1.0000 | ORAL_TABLET | Freq: Every day | ORAL | Status: DC
  Administered 2018-04-21 – 2018-04-23 (×3): 1 via ORAL
  Filled 2018-04-21 (×6): qty 1

## 2018-04-21 MED ORDER — VITAMIN B-1 100 MG PO TABS
100.0000 mg | ORAL_TABLET | Freq: Every day | ORAL | Status: DC
  Administered 2018-04-22 – 2018-04-23 (×2): 100 mg via ORAL
  Filled 2018-04-21 (×4): qty 1

## 2018-04-21 MED ORDER — LORAZEPAM 1 MG PO TABS
1.0000 mg | ORAL_TABLET | Freq: Four times a day (QID) | ORAL | Status: DC | PRN
  Administered 2018-04-21 – 2018-04-22 (×2): 1 mg via ORAL
  Filled 2018-04-21 (×2): qty 1

## 2018-04-21 MED ORDER — CITALOPRAM HYDROBROMIDE 10 MG PO TABS
10.0000 mg | ORAL_TABLET | Freq: Every day | ORAL | Status: DC
  Administered 2018-04-21 – 2018-04-23 (×3): 10 mg via ORAL
  Filled 2018-04-21 (×7): qty 1

## 2018-04-21 NOTE — Progress Notes (Signed)
[facility] MD Progress Note  04/21/2018 1:03 PM Marcus Gill  MRN:  [id number] Subjective: Patient reports feeling somewhat better than upon admission.  Does describe some ongoing depression, dysphoria and today expresses interest in starting an antidepressant medication. Endorses some aches, malaise associated with withdrawal but in general states that symptoms have not been severe. Denies medication side effects at this time. Denies suicidal ideations. Objective: I have discussed case with treatment team and have met with patient. 37 year old single male, lives with father, currently unemployed. Presented to hospital for worsening depression, neuro-vegetative symptoms, SI which he characterizes as passive, and requesting help for substance use disorder. Reports opiate dependence, cannabis dependence, and occasional Xanax abuse ( but does not use BZDs regularly). Was using 10 Roxicet tablets daily ( crushed/insufflated- denies IVDA) . Currently patient presents vaguely depressed, constricted, although affect does become more reactive during session and he smiles at times appropriately.  Denies suicidal ideations. Describes some opiate withdrawal symptoms such as headache, myalgias, malaise, but denies vomiting or diarrhea at this time and has tolerated p.o. intake well. Patient had reported only occasional Xanax abuse prior to admission but at this time states that he had been using this substance more regularly over a few days preceding admission.  Currently no tremors, no diaphoresis, no tachycardia, no psychomotor restlessness. No disruptive behaviors, polite on approach, limited milieu participation. Principal Problem: opiate use disorder, cannabis use disorder, substance-induced mood disorder Diagnosis: Active Problems:   Severe recurrent major depression without psychotic features ([place])  Total Time spent with patient: 20 minutes  Past Psychiatric History:   Past Medical History:  Past  Medical History:  Diagnosis Date  . Clavicle fracture    History reviewed. No pertinent surgical history. Family History: History reviewed. No pertinent family history. Family Psychiatric  History:  Social History:  Social History   Substance and Sexual Activity  Alcohol Use Never  . Frequency: Never     Social History   Substance and Sexual Activity  Drug Use Yes   Comment: opiates, snort heroin 3 weeks ago    Social History   Socioeconomic History  . Marital status: Single    Spouse name: Not on file  . Number of children: Not on file  . Years of education: Not on file  . Highest education level: Not on file  Occupational History  . Not on file  Social Needs  . Financial resource strain: Very hard  . Food insecurity:    Worry: Not on file    Inability: Not on file  . Transportation needs:    Medical: Not on file    Non-medical: Not on file  Tobacco Use  . Smoking status: Current Every Day Smoker    Packs/day: 2.00    Years: 15.00    Pack years: 30.00    Types: Cigarettes  . Smokeless tobacco: Never Used  Substance and Sexual Activity  . Alcohol use: Never    Frequency: Never  . Drug use: Yes    Comment: opiates, snort heroin 3 weeks ago  . Sexual activity: Yes  Lifestyle  . Physical activity:    Days per week: Not on file    Minutes per session: Not on file  . Stress: Not on file  Relationships  . Social connections:    Talks on phone: Not on file    Gets together: Not on file    Attends religious service: Not on file    Active member of club or organization: Not  on file    Attends meetings of clubs or organizations: Not on file    Relationship status: Not on file  Other Topics Concern  . Not on file  Social History Narrative  . Not on file   Additional Social History:    History of alcohol / drug use?: Yes Longest period of sobriety (when/how long): 6 months Negative Consequences of Use: Personal relationships Withdrawal Symptoms:  Diarrhea Name of Substance 1: opiates 1 - Age of First Use: 17  Sleep: Fair  Appetite:  Improvin  Current Medications: Current Facility-Administered Medications  Medication Dose Route Frequency Provider Last Rate Last Dose  . acetaminophen (TYLENOL) tablet 650 mg  650 mg Oral Q4H PRN Lindon Romp A, NP   650 mg at 04/21/18 0803  . alum & mag hydroxide-simeth (MAALOX/MYLANTA) 200-200-20 MG/5ML suspension 30 mL  30 mL Oral Q4H PRN Lindon Romp A, NP      . citalopram (CELEXA) tablet 10 mg  10 mg Oral Daily ,  A, MD      . cloNIDine (CATAPRES) tablet 0.1 mg  0.1 mg Oral QID Lindon Romp A, NP   0.1 mg at 04/21/18 0800   Followed by  . [START ON 04/22/2018] cloNIDine (CATAPRES) tablet 0.1 mg  0.1 mg Oral BH-qamhs Rozetta Nunnery, NP       Followed by  . [START ON 04/24/2018] cloNIDine (CATAPRES) tablet 0.1 mg  0.1 mg Oral QAC breakfast Lindon Romp A, NP      . dicyclomine (BENTYL) tablet 20 mg  20 mg Oral Q6H PRN Lindon Romp A, NP   20 mg at 04/20/18 0831  . hydrOXYzine (ATARAX/VISTARIL) tablet 25 mg  25 mg Oral Q6H PRN Lindon Romp A, NP   25 mg at 04/20/18 2113  . Influenza vac split quadrivalent PF (FLUARIX) injection 0.5 mL  0.5 mL Intramuscular Tomorrow-1000 Lindon Romp A, NP      . loperamide (IMODIUM) capsule 2-4 mg  2-4 mg Oral PRN Lindon Romp A, NP   2 mg at 04/20/18 1701  . LORazepam (ATIVAN) tablet 1 mg  1 mg Oral Q6H PRN , Myer Peer, MD      . magnesium hydroxide (MILK OF MAGNESIA) suspension 30 mL  30 mL Oral Daily PRN Lindon Romp A, NP      . methocarbamol (ROBAXIN) tablet 500 mg  500 mg Oral Q8H PRN Rozetta Nunnery, NP      . multivitamin with minerals tablet 1 tablet  1 tablet Oral Daily ,  A, MD      . naproxen (NAPROSYN) tablet 500 mg  500 mg Oral BID PRN Lindon Romp A, NP      . nicotine (NICODERM CQ - dosed in mg/24 hours) patch 21 mg  21 mg Transdermal Daily Lindon Romp A, NP   21 mg at 04/21/18 0801  . ondansetron (ZOFRAN-ODT)  disintegrating tablet 4 mg  4 mg Oral Q6H PRN Lindon Romp A, NP      . pneumococcal 23 valent vaccine (PNU-IMMUNE) injection 0.5 mL  0.5 mL Intramuscular Tomorrow-1000 Lindon Romp A, NP      . potassium chloride (K-DUR,KLOR-CON) CR tablet 10 mEq  10 mEq Oral BID , Myer Peer, MD   10 mEq at 04/21/18 0800  . [START ON 04/22/2018] thiamine (VITAMIN B-1) tablet 100 mg  100 mg Oral Daily ,  A, MD      . traZODone (DESYREL) tablet 50 mg  50 mg Oral QHS PRN Rozetta Nunnery, NP  50 mg at 04/20/18 2113    Lab Results:  Results for orders placed or performed during the hospital encounter of 04/19/18 (from the past 48 hour(s))  Lipid panel     Status: Abnormal   Collection Time: 04/20/18  6:31 AM  Result Value Ref Range   Cholesterol 161 0 - 200 mg/dL   Triglycerides 73 <150 mg/dL    Comment: RESULTS CONFIRMED BY MANUAL DILUTION   HDL 29 (L) >40 mg/dL   Total CHOL/HDL Ratio 5.6 RATIO   VLDL 15 0 - 40 mg/dL   LDL Cholesterol 117 (H) 0 - 99 mg/dL    Comment:        Total Cholesterol/HDL:CHD Risk Coronary Heart Disease Risk Table                     Men   Women  1/2 Average Risk   3.4   3.3  Average Risk       5.0   4.4  2 X Average Risk   9.6   7.1  3 X Average Risk  23.4   11.0        Use the calculated Patient Ratio above and the CHD Risk Table to determine the patient's CHD Risk.        ATP III CLASSIFICATION (LDL):  <100     mg/dL   Optimal  100-129  mg/dL   Near or Above                    Optimal  130-159  mg/dL   Borderline  160-189  mg/dL   High  >190     mg/dL   Very High Performed at [facility] [address] [place], [place] [address]   TSH     Status: None   Collection Time: 04/20/18  6:31 AM  Result Value Ref Range   TSH 1.180 0.350 - 4.500 uIU/mL    Comment: Performed by a 3rd Generation assay with a functional sensitivity of <=0.01 uIU/mL. Performed at [facility], [place] [address] [place], [place]  [address]     Blood Alcohol level:  Lab Results  Component Value Date   ETH <10 75/03/2584    Metabolic Disorder Labs: No results found for: HGBA1C, MPG No results found for: PROLACTIN Lab Results  Component Value Date   CHOL 161 04/20/2018   TRIG 73 04/20/2018   HDL 29 (L) 04/20/2018   CHOLHDL 5.6 04/20/2018   VLDL 15 04/20/2018   LDLCALC 117 (H) 04/20/2018    Physical Findings: AIMS: Facial and Oral Movements Muscles of Facial Expression: None, normal Lips and Perioral Area: None, normal Jaw: None, normal Tongue: None, normal,Extremity Movements Upper (arms, wrists, hands, fingers): None, normal Lower (legs, knees, ankles, toes): None, normal, Trunk Movements Neck, shoulders, hips: None, normal, Overall Severity Severity of abnormal movements (highest score from questions above): None, normal Incapacitation due to abnormal movements: None, normal Patient's awareness of abnormal movements (rate only patient's report): No Awareness, Dental Status Current problems with teeth and/or dentures?: No Does patient usually wear dentures?: No  CIWA:  CIWA-Ar Total: 6 COWS:  COWS Total Score: 6  Musculoskeletal: Strength & Muscle Tone: within normal limits-no tremors, no diaphoresis, no restlessness Gait & Station: normal Patient leans: N/A  Psychiatric Specialty Exam: Physical Exam  ROS headache, myalgias, malaise, no vomiting, no fever, no chills  Blood pressure 104/61, pulse 78, temperature (!) 97.5 F (36.4 C), temperature source Oral,  resp. rate 18, height 5' 8.9" (1.75 m), weight 63 kg, SpO2 99 %.Body mass index is 20.59 kg/m.  General Appearance: Fairly Groomed  Eye Contact:  Fair  Speech:  Normal Rate  Volume:  Decreased  Mood:  Reports some improvement, remains depressed  Affect:  Constricted, smiles at times appropriately  Thought Process:  Linear and Descriptions of Associations: Intact  Orientation:  Other:  Fully alert and attentive  Thought Content:  No  hallucinations, no delusions  Suicidal Thoughts:  No at this time denies suicidal or self-injurious ideations, denies homicidal or violent ideations  Homicidal Thoughts:  No  Memory:  Recent and remote grossly intact  Judgement:  Fair  Insight:  Fair  Psychomotor Activity:  Decreased  Concentration:  Concentration: Good and Attention Span: Good  Recall:  Good  Fund of Knowledge:  Good  Language:  Good  Akathisia:  Negative  Handed:  Right  AIMS (if indicated):     Assets:  Desire for Improvement Resilience  ADL's:  Intact  Cognition:  WNL  Sleep:  Number of Hours: 5.75   Assessment-  37 year old single male, lives with father, currently unemployed. Presented to hospital for worsening depression, neuro-vegetative symptoms, SI which he characterizes as passive, and requesting help for substance use disorder. Reports opiate dependence, cannabis dependence, and occasional Xanax abuse ( but does not use BZDs regularly). Was using 10 Roxicet tablets daily ( crushed/insufflated- denies IVDA) .  Patient presents with some persistent depression, dysphoria, constricted affect.  Denies suicidal ideations.  Describes some opiate withdrawal symptoms.  Today states he has been using Xanax more regularly over several days prior to admission but currently is not presenting with significant benzodiazepine withdrawal symptoms and vitals are stable.  He is expressing interest in starting an antidepressant medication.   Treatment Plan Summary: Daily contact with patient to assess and evaluate symptoms and progress in treatment, Medication management, Plan Inpatient treatment and Medications as below Encourage group and milieu participation to work on coping skills and symptom reduction Encourage efforts to work on sobriety and relapse prevention Start Celexa 10 mg daily for depression Continue Clonidine taper/opiate detox protocol Continue Trazodone 50 mg nightly PRN for insomnia Start Ativan PRNs for  potential BZD Withdrawal as per CIWA protocol, if needed  Treatment team working on disposition planning options.  Patient has expressed interest in Suboxone management following discharge. Jenne Campus, MD 04/21/2018, 1:03 PM

## 2018-04-21 NOTE — Progress Notes (Signed)
Recreation Therapy Notes  Date: 11.18.19 Time: 0930 Location: [address] Dayroom  Group Topic: Stress Management  Goal Area(s) Addresses:  Patient will verbalize importance of using healthy stress management.  Patient will identify positive emotions associated with healthy stress management.   Intervention: Stress Management  Activity :  Guided Imagery.  LRT introduced the stress management technique of guided imagery.  LRT read a script that took patients on a journey through the forest.  Patients were to listen as the script was read to engage in the activity.  Education:  Stress Management, Discharge Planning.   Education Outcome: Acknowledges edcuation/In group clarification offered/Needs additional education  Clinical Observations/Feedback: Pt did not attend group.     Caroll RancherMarjette Orest Dygert,  LRT/CTRS         Caroll RancherLindsay, Summerlynn Glauser A 04/21/2018 11:32 AM

## 2018-04-21 NOTE — Tx Team (Signed)
Interdisciplinary Treatment and Diagnostic Plan Update  04/21/2018 Time of Session:  TERIQUE KAWABATA MRN: [id number]  Principal Diagnosis: <principal problem not specified>  Secondary Diagnoses: Active Problems:   Severe recurrent major depression without psychotic features ([facility])   Current Medications:  Current Facility-Administered Medications  Medication Dose Route Frequency Provider Last Rate Last Dose  . acetaminophen (TYLENOL) tablet 650 mg  650 mg Oral Q4H PRN Nira Conn A, NP   650 mg at 04/21/18 1303  . alum & mag hydroxide-simeth (MAALOX/MYLANTA) 200-200-20 MG/5ML suspension 30 mL  30 mL Oral Q4H PRN Nira Conn A, NP      . citalopram (CELEXA) tablet 10 mg  10 mg Oral Daily Cobos, Rockey Situ, MD   10 mg at 04/21/18 1303  . cloNIDine (CATAPRES) tablet 0.1 mg  0.1 mg Oral QID Nira Conn A, NP   0.1 mg at 04/21/18 1303   Followed by  . [START ON 04/22/2018] cloNIDine (CATAPRES) tablet 0.1 mg  0.1 mg Oral BH-qamhs Jackelyn Poling, NP       Followed by  . [START ON 04/24/2018] cloNIDine (CATAPRES) tablet 0.1 mg  0.1 mg Oral QAC breakfast Nira Conn A, NP      . dicyclomine (BENTYL) tablet 20 mg  20 mg Oral Q6H PRN Nira Conn A, NP   20 mg at 04/20/18 0831  . hydrOXYzine (ATARAX/VISTARIL) tablet 25 mg  25 mg Oral Q6H PRN Nira Conn A, NP   25 mg at 04/20/18 2113  . loperamide (IMODIUM) capsule 2-4 mg  2-4 mg Oral PRN Nira Conn A, NP   2 mg at 04/20/18 1701  . LORazepam (ATIVAN) tablet 1 mg  1 mg Oral Q6H PRN Cobos, Rockey Situ, MD      . magnesium hydroxide (MILK OF MAGNESIA) suspension 30 mL  30 mL Oral Daily PRN Nira Conn A, NP      . methocarbamol (ROBAXIN) tablet 500 mg  500 mg Oral Q8H PRN Nira Conn A, NP      . multivitamin with minerals tablet 1 tablet  1 tablet Oral Daily Cobos, Rockey Situ, MD   1 tablet at 04/21/18 1303  . naproxen (NAPROSYN) tablet 500 mg  500 mg Oral BID PRN Nira Conn A, NP      . nicotine (NICODERM CQ - dosed in mg/24 hours) patch 21  mg  21 mg Transdermal Daily Nira Conn A, NP   21 mg at 04/21/18 0801  . ondansetron (ZOFRAN-ODT) disintegrating tablet 4 mg  4 mg Oral Q6H PRN Nira Conn A, NP      . potassium chloride (K-DUR,KLOR-CON) CR tablet 10 mEq  10 mEq Oral BID Cobos, Rockey Situ, MD   10 mEq at 04/21/18 0800  . [START ON 04/22/2018] thiamine (VITAMIN B-1) tablet 100 mg  100 mg Oral Daily Cobos, Fernando A, MD      . traZODone (DESYREL) tablet 50 mg  50 mg Oral QHS PRN Jackelyn Poling, NP   50 mg at 04/20/18 2113   PTA Medications: Medications Prior to Admission  Medication Sig Dispense Refill Last Dose  . acetaminophen (TYLENOL) 500 MG tablet Take 1,000 mg by mouth every 6 (six) hours as needed for mild pain.   04/19/2018 at Unknown time    Patient Stressors: Financial difficulties Marital or family conflict Occupational concerns Substance abuse  Patient Strengths: Manufacturing systems engineer Physical Health Supportive family/friends  Treatment Modalities: Medication Management, Group therapy, Case management,  1 to 1 session with clinician, Psychoeducation, Recreational therapy.  Physician Treatment Plan for Primary Diagnosis: <principal problem not specified> Long Term Goal(s): Improvement in symptoms so as ready for discharge Improvement in symptoms so as ready for discharge   Short Term Goals: Ability to identify changes in lifestyle to reduce recurrence of condition will improve Ability to maintain clinical measurements within normal limits will improve Ability to identify triggers associated with substance abuse/mental health issues will improve Ability to identify changes in lifestyle to reduce recurrence of condition will improve Ability to verbalize feelings will improve Ability to disclose and discuss suicidal ideas Ability to demonstrate self-control will improve Ability to identify and develop effective coping behaviors will improve Ability to maintain clinical measurements within normal limits  will improve  Medication Management: Evaluate patient's response, side effects, and tolerance of medication regimen.  Therapeutic Interventions: 1 to 1 sessions, Unit Group sessions and Medication administration.  Evaluation of Outcomes: Progressing  Physician Treatment Plan for Secondary Diagnosis: Active Problems:   Severe recurrent major depression without psychotic features ([facility])  Long Term Goal(s): Improvement in symptoms so as ready for discharge Improvement in symptoms so as ready for discharge   Short Term Goals: Ability to identify changes in lifestyle to reduce recurrence of condition will improve Ability to maintain clinical measurements within normal limits will improve Ability to identify triggers associated with substance abuse/mental health issues will improve Ability to identify changes in lifestyle to reduce recurrence of condition will improve Ability to verbalize feelings will improve Ability to disclose and discuss suicidal ideas Ability to demonstrate self-control will improve Ability to identify and develop effective coping behaviors will improve Ability to maintain clinical measurements within normal limits will improve     Medication Management: Evaluate patient's response, side effects, and tolerance of medication regimen.  Therapeutic Interventions: 1 to 1 sessions, Unit Group sessions and Medication administration.  Evaluation of Outcomes: Progressing   RN Treatment Plan for Primary Diagnosis: <principal problem not specified> Long Term Goal(s): Knowledge of disease and therapeutic regimen to maintain health will improve  Short Term Goals: Ability to participate in decision making will improve, Ability to verbalize feelings will improve, Ability to disclose and discuss suicidal ideas and Ability to identify and develop effective coping behaviors will improve  Medication Management: RN will administer medications as ordered by provider, will assess and  evaluate patient's response and provide education to patient for prescribed medication. RN will report any adverse and/or side effects to prescribing provider.  Therapeutic Interventions: 1 on 1 counseling sessions, Psychoeducation, Medication administration, Evaluate responses to treatment, Monitor vital signs and CBGs as ordered, Perform/monitor CIWA, COWS, AIMS and Fall Risk screenings as ordered, Perform wound care treatments as ordered.  Evaluation of Outcomes: Progressing   LCSW Treatment Plan for Primary Diagnosis: <principal problem not specified> Long Term Goal(s): Safe transition to appropriate next level of care at discharge, Engage patient in therapeutic group addressing interpersonal concerns.  Short Term Goals: Engage patient in aftercare planning with referrals and resources  Therapeutic Interventions: Assess for all discharge needs, 1 to 1 time with Social worker, Explore available resources and support systems, Assess for adequacy in community support network, Educate family and significant other(s) on suicide prevention, Complete Psychosocial Assessment, Interpersonal group therapy.  Evaluation of Outcomes: Progressing   Progress in Treatment: Attending groups: Yes. Participating in groups: Yes. Taking medication as prescribed: Yes. Toleration medication: Yes. Family/Significant other contact made: No, will contact:  patient declines consent  Patient understands diagnosis: Yes. Discussing patient identified problems/goals with staff: Yes. Medical problems stabilized or  resolved: Yes. Denies suicidal/homicidal ideation: Yes. Issues/concerns per patient self-inventory: No. Other:   New problem(s) identified: None   New Short Term/Long Term Goal(s): medication stabilization, elimination of SI thoughts, development of comprehensive mental wellness plan.    Patient Goals:  I want to get clean and I need coping skills.   Discharge Plan or Barriers: CSW will assess for  appropriate referrals and possible discharge planning.   Reason for Continuation of Hospitalization: Anxiety Depression Medication stabilization Suicidal ideation  Estimated Length of Stay: 3-5 days   Attendees: Patient: Emeterio ReeveSamuel Borowiak  04/21/2018 3:49 PM  Physician: Dr. Nehemiah MassedFernando Cobos, MD 04/21/2018 3:49 PM  Nursing: Huntley DecSara.Karie Schwalbe, RN 04/21/2018 3:49 PM  RN Care Manager: Onnie BoerJennifer Clark, RN 04/21/2018 3:49 PM  Social Worker: Baldo DaubJolan Vici Novick, LCSWA 04/21/2018 3:49 PM  Recreational Therapist:  04/21/2018 3:49 PM  Other:  04/21/2018 3:49 PM  Other:  04/21/2018 3:49 PM  Other: 04/21/2018 3:49 PM    Scribe for Treatment Team: Maeola SarahJolan E Amrutha Avera, LCSWA 04/21/2018 3:49 PM

## 2018-04-21 NOTE — Progress Notes (Signed)
Pt is observed in the dayroom, seen watching TV. Pt appears flat in affect and mood. Pt was minimal on approach. Pt denies SI/HI/AVH at this time. Rates pain 8/10; Headache. Pt states he has a sinus HA from snorting cocaine. Pt c/o of withdrawal s/s this evening. BP soft; encourage to push fluids; gatorade given. Support and encouragement provided. Pt states he is not interested in long term rehab at this time.Will continue with POC.

## 2018-04-21 NOTE — Progress Notes (Signed)
Patient ID: Marcus Gill, male   DOB: 12-02-1980, 37 y.o.   MRN: [id number]   Pt currently presents with a flat affect and cooperative behavior. Reports ongoing withdrawal symptoms including generalized malaise, headache and anxiety. Pt states goal is to "feel better." Pt reports that he will like to attend a Subaxone clinic post discharge. Pt reports using 3-4mg  of Xanax a day PTA.   Pt provided with medications per providers orders. Pt's labs and vitals were monitored during the shift. Pt given a 1:1 about emotional and mental status. Pt supported and encouraged to express concerns and questions. Pt educated on medications and opiate withdrawal. MD notified of patients benzo use.   Pt's safety ensured with 15 minute and environmental checks. Pt currently denies SI/HI and A/V hallucinations. Pt verbally agrees to seek staff if SI/HI or A/VH occurs and to consult with staff before acting on any harmful thoughts. Will continue POC.

## 2018-04-21 NOTE — Progress Notes (Signed)
Writer has observed patient asleep at beginning of shift and he later woke up and requested medication from writer for a headache and he received prns for his withdrawal symptoms. He received snacks and a pitcher of gatorade for hydration. Support given and safety maintained on unit with 15 min checks.

## 2018-04-22 DIAGNOSIS — F1124 Opioid dependence with opioid-induced mood disorder: Secondary | ICD-10-CM

## 2018-04-22 DIAGNOSIS — F1320 Sedative, hypnotic or anxiolytic dependence, uncomplicated: Secondary | ICD-10-CM

## 2018-04-22 DIAGNOSIS — F1123 Opioid dependence with withdrawal: Secondary | ICD-10-CM

## 2018-04-22 DIAGNOSIS — F1193 Opioid use, unspecified with withdrawal: Secondary | ICD-10-CM

## 2018-04-22 DIAGNOSIS — F1994 Other psychoactive substance use, unspecified with psychoactive substance-induced mood disorder: Secondary | ICD-10-CM

## 2018-04-22 LAB — HEMOGLOBIN A1C
HEMOGLOBIN A1C: 5.5 % (ref 4.8–5.6)
Mean Plasma Glucose: 111 mg/dL

## 2018-04-22 NOTE — Progress Notes (Addendum)
D: Pt reports receiving a "good" nights sleep last night r/t receiving sleep medications which the pt found helpful. Pt reports having a "good" appetite, a "normal" energy level, and "good" concentration. Pt rates depression 4/10, hopelessness, 0/10, and anxiety 9/10. Pt is experiencing some withdrawal symptoms such as: tremor, cravings and a runny nose. Pt is experiencing pain. Pt reported a headache rate 6/10 this morning, in which pt received 650 mg of Tylenol. Pt goal: to enjoy day by doing anything he can to make it enjoyable.   Pt was found smoking during environmental checks by [facility]. Pt was taking a shower while smoking when found by MHT. MHT saw/smelled smoke and when MHT asked the pt if he was smoking pt denied at first. Pt then admitted he was smoking and willingly showed/handed over six cigarettes and two lighters. When asked where he got them from pt stated that he had them in his shoe. Pt stated that no one checked his shoe upon admission. Items were located under flap of shoe.  A: Scheduled medications administered to pt, per MD orders. Support and encouragement provided. Frequent verbal contact made. Routine safety checks conducted q15 minutes.  R: No Adverse medication reaction noted. Pt verbally contracts for safety at this time. Pt complaint with medications. Pt interacts well with others on the unit. Pt remains safe at this time. Will continue to monitor.

## 2018-04-22 NOTE — [facility] Group Notes (Signed)
Adult Nursing Psychoeducational Group Note  Date:  04/22/2018 Time:  4:00 PM  Group Topic/Focus: Early Warning Signs Early Warning Signs:   The focus of this group is to help patients identify signs or symptoms they exhibit before slipping into an unhealthy state or crisis.  Participation Level:  Active  Participation Quality:  Appropriate  Affect:  Appropriate  Cognitive:  Alert and Oriented  Insight: Appropriate and Improving  Engagement in Group:  Developing/Improving  Modes of Intervention:  Discussion, Education and Socialization  Additional Comments:  Patient reports his early warning signs included increased drug and alcohol use.  Marcus Gill 04/22/2018, 4:45 PM

## 2018-04-22 NOTE — Progress Notes (Signed)
Adult Psychoeducational Group Note  Date:  04/22/2018 Time:  9:37 PM  Group Topic/Focus:  Wrap-Up Group:   The focus of this group is to help patients review their daily goal of treatment and discuss progress on daily workbooks.  Participation Level:  Active  Participation Quality:  Appropriate  Affect:  Appropriate  Cognitive:  Appropriate  Insight: Appropriate  Engagement in Group:  Engaged  Modes of Intervention:  Discussion  Additional Comments:  Pt goal was to enjoy the day and open up more. Pt met goals.  Pt rated the day at a 8/10.  Nesa Distel 04/22/2018, 9:37 PM

## 2018-04-22 NOTE — Progress Notes (Signed)
Adult Psychoeducational Group Note  Date:  04/22/2018 Time:  9:26 AM  Group Topic/Focus:  Goals Group:   The focus of this group is to help patients establish daily goals to achieve during treatment and discuss how the patient can incorporate goal setting into their daily lives to aide in recovery.  Participation Level:  Active  Participation Quality:  Appropriate  Affect:  Appropriate  Cognitive:  Appropriate  Insight: Appropriate  Engagement in Group:  Engaged  Modes of Intervention:  Discussion  Additional Comments:  Pt attended morning group.   Marcus Gill 04/22/2018, 9:26 AM

## 2018-04-22 NOTE — Progress Notes (Signed)
During environmental checks smoke was seen coming from the bathroom. We asked the pt about the smoke. The pt showed us where he was hiding the contraband. We disposed the contraband and [facility]

## 2018-04-22 NOTE — Progress Notes (Signed)
Patient attended group and participated

## 2018-04-22 NOTE — Progress Notes (Signed)
Pt is observed in the dayroom, seen watching TV. Pt appears flat/anxious in affect and mood. Pt was minimal on approach. Pt denies SI/HI/AVH at this time. Rates pain 6/10; Headache. Pt states headache is improving. Pt c/o of withdrawal s/s this evening. Pt states he wants to home after discharge and then proceed with short term rehab after.Support and encouragement provided. Prn ativan, trazodone, and tylenol requested and given. BP/HR WNL.Will continue with POC.

## 2018-04-22 NOTE — Progress Notes (Signed)
[facility] MD Progress Note  04/22/2018 12:27 PM Marcus Gill  MRN:  [id number] Subjective: Patient is seen and examined.  Patient is a 37 year old male with a past psychiatric history significant for opiate dependence, substance-induced mood disorder, benzodiazepine use disorder, cannabis use disorder.  He was admitted secondary to passive suicidal ideation and depression.  Objective: Patient is seen and examined.  Patient is a 37 year old male with the above-stated past psychiatric history who is seen in follow-up.  His biggest complaint today is headache.  We discussed the symptoms of Xanax withdrawal as well as opiate withdrawal.  The patient stated he is interested in getting on Suboxone after discharge.  I told the patient that we would be unable to start him on that medication, given the fact we were unable to be able to confirm where he might be able to get treatment.  We discussed his previous history.  We discussed previous detox as well as rehabilitation stays.  He denied any previous rehabilitation stays.  He stated he is unable to go into any kind of rehab because he has to get back to take care of a family member.  He denied suicidal ideation.  There is no evidence of tremor, diaphoresis, tachycardia or psychomotor restlessness to a significant degree.  He denied any suicidal ideation.  Principal Problem: <principal problem not specified> Diagnosis: Active Problems:   Severe recurrent major depression without psychotic features (HCC)  Total Time spent with patient: 15 minutes  Past Psychiatric History: See admission H&P  Past Medical History:  Past Medical History:  Diagnosis Date  . Clavicle fracture    History reviewed. No pertinent surgical history. Family History: History reviewed. No pertinent family history. Family Psychiatric  History: See admission H&P Social History:  Social History   Substance and Sexual Activity  Alcohol Use Never  . Frequency: Never     Social  History   Substance and Sexual Activity  Drug Use Yes   Comment: opiates, snort heroin 3 weeks ago    Social History   Socioeconomic History  . Marital status: Single    Spouse name: Not on file  . Number of children: Not on file  . Years of education: Not on file  . Highest education level: Not on file  Occupational History  . Not on file  Social Needs  . Financial resource strain: Very hard  . Food insecurity:    Worry: Not on file    Inability: Not on file  . Transportation needs:    Medical: Not on file    Non-medical: Not on file  Tobacco Use  . Smoking status: Current Every Day Smoker    Packs/day: 2.00    Years: 15.00    Pack years: 30.00    Types: Cigarettes  . Smokeless tobacco: Never Used  Substance and Sexual Activity  . Alcohol use: Never    Frequency: Never  . Drug use: Yes    Comment: opiates, snort heroin 3 weeks ago  . Sexual activity: Yes  Lifestyle  . Physical activity:    Days per week: Not on file    Minutes per session: Not on file  . Stress: Not on file  Relationships  . Social connections:    Talks on phone: Not on file    Gets together: Not on file    Attends religious service: Not on file    Active member of club or organization: Not on file    Attends meetings of clubs or organizations: Not  on file    Relationship status: Not on file  Other Topics Concern  . Not on file  Social History Narrative  . Not on file   Additional Social History:    History of alcohol / drug use?: Yes Longest period of sobriety (when/how long): 6 months Negative Consequences of Use: Personal relationships Withdrawal Symptoms: Diarrhea Name of Substance 1: opiates 1 - Age of First Use: 17                  Sleep: Fair  Appetite:  Fair  Current Medications: Current Facility-Administered Medications  Medication Dose Route Frequency Provider Last Rate Last Dose  . acetaminophen (TYLENOL) tablet 650 mg  650 mg Oral Q4H PRN Nira ConnBerry, Jason A, NP    650 mg at 04/22/18 0810  . alum & mag hydroxide-simeth (MAALOX/MYLANTA) 200-200-20 MG/5ML suspension 30 mL  30 mL Oral Q4H PRN Nira ConnBerry, Jason A, NP      . citalopram (CELEXA) tablet 10 mg  10 mg Oral Daily Cobos, Rockey SituFernando A, MD   10 mg at 04/22/18 0806  . cloNIDine (CATAPRES) tablet 0.1 mg  0.1 mg Oral BH-qamhs Nira ConnBerry, Jason A, NP   0.1 mg at 04/22/18 0806   Followed by  . [START ON 04/24/2018] cloNIDine (CATAPRES) tablet 0.1 mg  0.1 mg Oral QAC breakfast Nira ConnBerry, Jason A, NP      . dicyclomine (BENTYL) tablet 20 mg  20 mg Oral Q6H PRN Nira ConnBerry, Jason A, NP   20 mg at 04/20/18 0831  . hydrOXYzine (ATARAX/VISTARIL) tablet 25 mg  25 mg Oral Q6H PRN Nira ConnBerry, Jason A, NP   25 mg at 04/21/18 2108  . loperamide (IMODIUM) capsule 2-4 mg  2-4 mg Oral PRN Nira ConnBerry, Jason A, NP   2 mg at 04/20/18 1701  . LORazepam (ATIVAN) tablet 1 mg  1 mg Oral Q6H PRN Cobos, Rockey SituFernando A, MD   1 mg at 04/21/18 2108  . magnesium hydroxide (MILK OF MAGNESIA) suspension 30 mL  30 mL Oral Daily PRN Nira ConnBerry, Jason A, NP      . methocarbamol (ROBAXIN) tablet 500 mg  500 mg Oral Q8H PRN Nira ConnBerry, Jason A, NP      . multivitamin with minerals tablet 1 tablet  1 tablet Oral Daily Cobos, Rockey SituFernando A, MD   1 tablet at 04/22/18 [id number]  . naproxen (NAPROSYN) tablet 500 mg  500 mg Oral BID PRN Nira ConnBerry, Jason A, NP      . nicotine (NICODERM CQ - dosed in mg/24 hours) patch 21 mg  21 mg Transdermal Daily Nira ConnBerry, Jason A, NP   21 mg at 04/22/18 0811  . ondansetron (ZOFRAN-ODT) disintegrating tablet 4 mg  4 mg Oral Q6H PRN Nira ConnBerry, Jason A, NP      . thiamine (VITAMIN B-1) tablet 100 mg  100 mg Oral Daily Cobos, Rockey SituFernando A, MD   100 mg at 04/22/18 0806  . traZODone (DESYREL) tablet 50 mg  50 mg Oral QHS PRN Jackelyn PolingBerry, Jason A, NP   50 mg at 04/21/18 2108    Lab Results: No results found for this or any previous visit (from the past 48 hour(s)).  Blood Alcohol level:  Lab Results  Component Value Date   ETH <10 04/19/2018    Metabolic Disorder Labs: Lab Results   Component Value Date   HGBA1C 5.5 04/20/2018   MPG 111 04/20/2018   No results found for: PROLACTIN Lab Results  Component Value Date   CHOL 161 04/20/2018   TRIG 73  04/20/2018   HDL 29 (L) 04/20/2018   CHOLHDL 5.6 04/20/2018   VLDL 15 04/20/2018   LDLCALC 117 (H) 04/20/2018    Physical Findings: AIMS: Facial and Oral Movements Muscles of Facial Expression: None, normal Lips and Perioral Area: None, normal Jaw: None, normal Tongue: None, normal,Extremity Movements Upper (arms, wrists, hands, fingers): None, normal Lower (legs, knees, ankles, toes): None, normal, Trunk Movements Neck, shoulders, hips: None, normal, Overall Severity Severity of abnormal movements (highest score from questions above): None, normal Incapacitation due to abnormal movements: None, normal Patient's awareness of abnormal movements (rate only patient's report): No Awareness, Dental Status Current problems with teeth and/or dentures?: No Does patient usually wear dentures?: No  CIWA:  CIWA-Ar Total: 6 COWS:  COWS Total Score: 6  Musculoskeletal: Strength & Muscle Tone: within normal limits Gait & Station: normal Patient leans: N/A  Psychiatric Specialty Exam: Physical Exam  Nursing note and vitals reviewed. Constitutional: He is oriented to person, place, and time. He appears well-developed and well-nourished.  HENT:  Head: Normocephalic and atraumatic.  Respiratory: Effort normal.  Neurological: He is alert and oriented to person, place, and time.    ROS  Blood pressure 113/63, pulse 81, temperature 97.6 F (36.4 C), resp. rate 18, height 5' 8.9" (1.75 m), weight 63 kg, SpO2 99 %.Body mass index is 20.59 kg/m.  General Appearance: Disheveled  Eye Contact:  Fair  Speech:  Normal Rate  Volume:  Decreased  Mood:  Anxious  Affect:  Congruent  Thought Process:  Coherent and Descriptions of Associations: Intact  Orientation:  Full (Time, Place, and Person)  Thought Content:  Logical   Suicidal Thoughts:  No  Homicidal Thoughts:  No  Memory:  Immediate;   Fair Recent;   Fair Remote;   Fair  Judgement:  Impaired  Insight:  Lacking  Psychomotor Activity:  Increased  Concentration:  Concentration: Fair and Attention Span: Fair  Recall:  [employer] of Knowledge:  Fair  Language:  Fair  Akathisia:  Negative  Handed:  Right  AIMS (if indicated):     Assets:  Communication Skills Desire for Improvement Housing Physical Health Resilience Social Support  ADL's:  Intact  Cognition:  WNL  Sleep:  Number of Hours: 6.25     Treatment Plan Summary: Daily contact with patient to assess and evaluate symptoms and progress in treatment, Medication management and Plan : Patient is seen and examined.  Patient is a 37 year old male with the above-stated past psychiatric history who is seen in follow-up.  #1 opiate withdrawal/benzodiazepine withdrawal-continue clonidine taper/opiate detox protocol.  Continue lorazepam PRN's for potential benzodiazepine withdrawal as per CIWA protocol. #2 continue trazodone 50 mg p.o. nightly as needed insomnia. #3 continue Celexa 10 mg p.o. daily for depression #4 patient at this time has not any desire for becoming involved in a rehabilitation facility.  He is more interested in outpatient Suboxone therapy after discharge.  Antonieta Pert, MD 04/22/2018, 12:27 PM

## 2018-04-23 MED ORDER — NICOTINE 21 MG/24HR TD PT24: 21.0000 mg | MEDICATED_PATCH | Freq: Every day | TRANSDERMAL | 0 refills | Status: DC

## 2018-04-23 MED ORDER — HYDROXYZINE HCL 25 MG PO TABS: 25.0000 mg | ORAL_TABLET | Freq: Four times a day (QID) | ORAL | 0 refills | Status: DC | PRN

## 2018-04-23 MED ORDER — TRAZODONE HCL 50 MG PO TABS: 50.0000 mg | ORAL_TABLET | Freq: Every evening | ORAL | 0 refills | Status: DC | PRN

## 2018-04-23 MED ORDER — CITALOPRAM HYDROBROMIDE 10 MG PO TABS: 10.0000 mg | ORAL_TABLET | Freq: Every day | ORAL | 0 refills | Status: DC

## 2018-04-23 NOTE — Plan of Care (Signed)
Discharge note  Patient verbalizes readiness for discharge. Follow up plan explained, AVS, Transition record and SRA given. Prescriptions and teaching provided. Belongings returned and signed for. Suicide safety plan completed and signed. Suicide hotline worksheet provided. Patient verbalizes understanding. Patient denies SI/HI and assures this Clinical research associatewriter he will seek assistance should that change. Patient discharged to lobby where father was waiting.  Problem: Education: Goal: Knowledge of [place] [facility] Education information/materials will improve Outcome: Adequate for Discharge Goal: Emotional status will improve Outcome: Adequate for Discharge Goal: Mental status will improve Outcome: Adequate for Discharge Goal: Verbalization of understanding the information provided will improve Outcome: Adequate for Discharge   Problem: Activity: Goal: Interest or engagement in activities will improve Outcome: Adequate for Discharge Goal: Sleeping patterns will improve Outcome: Adequate for Discharge   Problem: Coping: Goal: Ability to verbalize frustrations and anger appropriately will improve Outcome: Adequate for Discharge Goal: Ability to demonstrate self-control will improve Outcome: Adequate for Discharge   Problem: Health Behavior/Discharge Planning: Goal: Identification of resources available to assist in meeting health care needs will improve Outcome: Adequate for Discharge Goal: Compliance with treatment plan for underlying cause of condition will improve Outcome: Adequate for Discharge   Problem: Physical Regulation: Goal: Ability to maintain clinical measurements within normal limits will improve Outcome: Adequate for Discharge   Problem: Safety: Goal: Periods of time without injury will increase Outcome: Adequate for Discharge   Problem: Education: Goal: Ability to make informed decisions regarding treatment will improve Outcome: Adequate for Discharge   Problem:  Coping: Goal: Coping ability will improve Outcome: Adequate for Discharge   Problem: Health Behavior/Discharge Planning: Goal: Identification of resources available to assist in meeting health care needs will improve Outcome: Adequate for Discharge   Problem: Medication: Goal: Compliance with prescribed medication regimen will improve Outcome: Adequate for Discharge   Problem: Self-Concept: Goal: Ability to disclose and discuss suicidal ideas will improve Outcome: Adequate for Discharge Goal: Will verbalize positive feelings about self Outcome: Adequate for Discharge   Problem: Activity: Goal: Will identify at least one activity in which they can participate Outcome: Adequate for Discharge   Problem: Coping: Goal: Ability to identify and develop effective coping behavior will improve Outcome: Adequate for Discharge Goal: Ability to interact with others will improve Outcome: Adequate for Discharge Goal: Demonstration of participation in decision-making regarding own care will improve Outcome: Adequate for Discharge Goal: Ability to use eye contact when communicating with others will improve Outcome: Adequate for Discharge   Problem: Health Behavior/Discharge Planning: Goal: Identification of resources available to assist in meeting health care needs will improve Outcome: Adequate for Discharge   Problem: Self-Concept: Goal: Will verbalize positive feelings about self Outcome: Adequate for Discharge   Problem: Education: Goal: Ability to state activities that reduce stress will improve Outcome: Adequate for Discharge   Problem: Coping: Goal: Ability to identify and develop effective coping behavior will improve Outcome: Adequate for Discharge   Problem: Self-Concept: Goal: Ability to identify factors that promote anxiety will improve Outcome: Adequate for Discharge Goal: Level of anxiety will decrease Outcome: Adequate for Discharge Goal: Ability to modify response  to factors that promote anxiety will improve Outcome: Adequate for Discharge   Problem: Education: Goal: Utilization of techniques to improve thought processes will improve Outcome: Adequate for Discharge Goal: Knowledge of the prescribed therapeutic regimen will improve Outcome: Adequate for Discharge   Problem: Activity: Goal: Interest or engagement in leisure activities will improve Outcome: Adequate for Discharge Goal: Imbalance in normal sleep/wake cycle will improve  Outcome: Adequate for Discharge   Problem: Coping: Goal: Coping ability will improve Outcome: Adequate for Discharge Goal: Will verbalize feelings Outcome: Adequate for Discharge   Problem: Health Behavior/Discharge Planning: Goal: Ability to make decisions will improve Outcome: Adequate for Discharge Goal: Compliance with therapeutic regimen will improve Outcome: Adequate for Discharge   Problem: Role Relationship: Goal: Will demonstrate positive changes in social behaviors and relationships Outcome: Adequate for Discharge   Problem: Safety: Goal: Ability to disclose and discuss suicidal ideas will improve Outcome: Adequate for Discharge Goal: Ability to identify and utilize support systems that promote safety will improve Outcome: Adequate for Discharge   Problem: Self-Concept: Goal: Will verbalize positive feelings about self Outcome: Adequate for Discharge Goal: Level of anxiety will decrease Outcome: Adequate for Discharge   Problem: Education: Goal: Knowledge of disease or condition will improve Outcome: Adequate for Discharge Goal: Understanding of discharge needs will improve Outcome: Adequate for Discharge   Problem: Health Behavior/Discharge Planning: Goal: Ability to identify changes in lifestyle to reduce recurrence of condition will improve Outcome: Adequate for Discharge Goal: Identification of resources available to assist in meeting health care needs will improve Outcome: Adequate  for Discharge   Problem: Physical Regulation: Goal: Complications related to the disease process, condition or treatment will be avoided or minimized Outcome: Adequate for Discharge   Problem: Safety: Goal: Ability to remain free from injury will improve Outcome: Adequate for Discharge

## 2018-04-23 NOTE — Progress Notes (Signed)
  [facility] :  Will you be returning to the same living situation after discharge:  No. Patient is discharging home with his father.  At discharge, do you have transportation home?: Yes,  patient reports his father is picking him up at discharge Do you have the ability to pay for your medications: No.  Release of information consent forms completed and in the chart;  Patient's signature needed at discharge.  Patient to Follow up at: Follow-up Information    [facility] Go on 04/[facility]/2018.   Why:  Please attend your appointment on Monday, 04/28/18 at 8:00a. Please bring your photo ID, proof of insurance, and discharge paperwork from this hospitalization.  Contact information: [address] [place], [place] [address] phone: [phone] fax:        Services, [facility]. Go on 04/22/2018.   Why:  Please attend your hospital follow up appointment on Friday, 04/25/18 at 10:30a.  Please bring photo ID, proof of insurance, social security card, and any discahrge paperwork from this hospitalization.  Contact information: [address] [place] [address] [phone]           Next level of care provider has access [facility]:yes  Safety Planning and Suicide Prevention discussed: Yes,  with the patient   Have you used any form of tobacco in the last 30 days? (Cigarettes, Smokeless Tobacco, Cigars, and/or Pipes): Yes  Has patient been referred to the Quitline?: Patient refused referral  Patient has been referred for addiction treatment: N/A  Maeola SarahJolan E Rannie Craney, LCSWA 04/23/2018, 2:15 PM

## 2018-04-23 NOTE — Discharge Summary (Signed)
Physician Discharge Summary Note  Patient:  Marcus Gill is an 37 y.o., male  MRN:  [id number]  DOB:  06-10-1980  Patient phone:  [phone] (home)   Patient address:   [address] [place] [address],   Total Time spent with patient: Greater than 30 minutes  Date of Admission:  04/19/2018  Date of Discharge: 04-23-18  Reason for Admission: Worsening depression & passive suicidal ideations.  Principal Problem: Opioid dependence with opioid-induced mood disorder [facility])  Discharge Diagnoses: Principal Problem:   Opioid dependence with opioid-induced mood disorder (HCC) Active Problems:   Severe recurrent major depression without psychotic features (HCC)   Substance induced mood disorder (HCC)   Benzodiazepine dependence (HCC)   Opiate withdrawal (HCC)  Past Psychiatric History: Opioid use disorder, Mdd  Past Medical History:  Past Medical History:  Diagnosis Date  . Clavicle fracture    History reviewed. No pertinent surgical history. Family History: History reviewed. No pertinent family history.  Family Psychiatric  History: See H&P  Social History:  Social History   Substance and Sexual Activity  Alcohol Use Never  . Frequency: Never     Social History   Substance and Sexual Activity  Drug Use Yes   Comment: opiates, snort heroin 3 weeks ago    Social History   Socioeconomic History  . Marital status: Single    Spouse name: Not on file  . Number of children: Not on file  . Years of education: Not on file  . Highest education level: Not on file  Occupational History  . Not on file  Social Needs  . Financial resource strain: Very hard  . Food insecurity:    Worry: Not on file    Inability: Not on file  . Transportation needs:    Medical: Not on file    Non-medical: Not on file  Tobacco Use  . Smoking status: Current Every Day Smoker    Packs/day: 2.00    Years: 15.00    Pack years: 30.00    Types: Cigarettes  . Smokeless tobacco:  Never Used  Substance and Sexual Activity  . Alcohol use: Never    Frequency: Never  . Drug use: Yes    Comment: opiates, snort heroin 3 weeks ago  . Sexual activity: Yes  Lifestyle  . Physical activity:    Days per week: Not on file    Minutes per session: Not on file  . Stress: Not on file  Relationships  . Social connections:    Talks on phone: Not on file    Gets together: Not on file    Attends religious service: Not on file    Active member of club or organization: Not on file    Attends meetings of clubs or organizations: Not on file    Relationship status: Not on file  Other Topics Concern  . Not on file  Social History Narrative  . Not on file   Hospital Course: (Per Md's admission evaluation): 37 year old male, presented to hospital voluntarily reporting depression, suicidal ideations, which he described as passive, endorsing history of opiate dependence . States he has been using Roxicet preferentially, at times more than 10 tablets per day, which he has been crushing and insufflating. Denies IVDA. States he has used Xanax, but not regularly ( less than once a week). Currently endorses depression, which he feels is at least partially substance induced, and states " I am just tired of living this way", " I am tired  of having to chase the drug all the time". Denies suicidal ideations at present, and states " I want to live". Endorses some neuro-vegetative symptoms of depression as below, denies anhedonia. Endorses some symptoms of WDL- cramps, nausea, diarrhea, feeling " hot and cold". Admission UDS positive for cannabis, opiates, BZD. Admission BAL negative.  Chirag was admitted to the [facility] with his UDS positive for Benzodiazepine, Opioid & THC. However, his reason for ED presentation was worsening depression & suicidal ideations which he stated was substance induced. He was seeking help to achieve & maintain sobriety.   After evaluation of his presenting symptoms, Mishon was  started on the Clonidine detoxification treatment protocols to re-stabilize his systems of Opioid intoxications. He was also enrolled in the group counseling sessions being offered & held on this unit to learn coping skills that should help him after discharge to cope better and manage his substance abuse issues to maintain sobriety. Besides the detoxification treatments, Glendal also received other medication regimen for his other depressive mood symptoms. He was medicated & discharged on; Citalopram 10 mg for depression, Hydroxyzine 25 mg prn for anxiety, Nicotine patch 21 mg for smoking cessation & Trazodone 50 mg prn for insomnia. He presented no other significant pre-existing health issues that required treatment. He tolerated his treatment regimen without any significant adverse effects or reactions reported.   Tallin is seen today by the attending psychiatrist. Pleased that he sought help. Says he is tolerating his medications well. No withdrawal symptoms. No craving for substance. He is no longer feeling depressed. Describes normal energy and ability to think. Able to focus on task. No suicidal thoughts. No homicidal thoughts. No thoughts of violence. Patient reports normal biological functions.    Nursing staff reports that patient has been appropriate on the unit. Patient has been interacting well with peers & staff. No behavioral issues. Patient has not voiced any suicidal thoughts. Patient has not been observed to be internally stimulated or occupied. Patient has been adherent with treatment recommendations. Patient has been tolerating his medication well, denies any adverse reactions or side effects.    Patient was discussed at the treatment  team meeting this morning. Team members feel that patient is back to his baseline level of function. Team agrees with plan to discharge patient today. Upon discharge, Woodford adamantly denies any SIHI, AVH, delusional thoughts, paranoia or substance withdrawal  symptoms. He will continue further psychiatric follow-up care/medication management on an outpatient basis as noted below. He was provided with all the necessary information needed to make this appointment without problems. He left [facility] with all personal belongings in no apparent distress.   Physical Findings: AIMS: Facial and Oral Movements Muscles of Facial Expression: None, normal Lips and Perioral Area: None, normal Jaw: None, normal Tongue: None, normal,Extremity Movements Upper (arms, wrists, hands, fingers): None, normal Lower (legs, knees, ankles, toes): None, normal, Trunk Movements Neck, shoulders, hips: None, normal, Overall Severity Severity of abnormal movements (highest score from questions above): None, normal Incapacitation due to abnormal movements: None, normal Patient's awareness of abnormal movements (rate only patient's report): No Awareness, Dental Status Current problems with teeth and/or dentures?: No Does patient usually wear dentures?: No  CIWA:  CIWA-Ar Total: 3 COWS:  COWS Total Score: 4  Musculoskeletal: Strength & Muscle Tone: within normal limits Gait & Station: normal Patient leans: N/A  Psychiatric Specialty Exam: Physical Exam  Nursing note and vitals reviewed. Constitutional: He is oriented to person, place, and time. He appears well-developed.  HENT:  Head: Normocephalic.  Eyes: Pupils are equal, round, and reactive to light.  Neck: Normal range of motion.  Cardiovascular: Normal rate.  Respiratory: Effort normal.  GI: Soft.  Genitourinary:  Genitourinary Comments: Degferred  Musculoskeletal: Normal range of motion.  Neurological: He is alert and oriented to person, place, and time.  Skin: Skin is warm and dry.    Review of Systems  Constitutional: Negative.   HENT: Negative.   Eyes: Negative.   Respiratory: Negative.  Negative for cough and shortness of breath.   Cardiovascular: Negative.  Negative for chest pain and palpitations.   Gastrointestinal: Negative.  Negative for abdominal pain, heartburn, nausea and vomiting.  Genitourinary: Negative.   Musculoskeletal: Negative.   Skin: Negative.   Neurological: Negative.  Negative for dizziness.  Endo/Heme/Allergies: Negative.   Psychiatric/Behavioral: Positive for depression (Stable) and substance abuse (Hx. opioid use disorder). Negative for hallucinations, memory loss and suicidal ideas. The patient has insomnia (Stable). The patient is not nervous/anxious (Stable).     Blood pressure 121/77, pulse 67, temperature 97.6 F (36.4 C), temperature source Oral, resp. rate 20, height 5' 8.9" (1.75 m), weight 63 kg, SpO2 98 %.Body mass index is 20.59 kg/m.  See Md's discharge SRA   Have you used any form of tobacco in the last 30 days? (Cigarettes, Smokeless Tobacco, Cigars, and/or Pipes): Yes  Has this patient used any form of tobacco in the last 30 days? (Cigarettes, Smokeless Tobacco, Cigars, and/or Pipes): Yes, an FDA-approved tobacco cessation medication was offered at discharge.   Blood Alcohol level:  Lab Results  Component Value Date   ETH <10 04/19/2018    Metabolic Disorder Labs:  Lab Results  Component Value Date   HGBA1C 5.5 04/20/2018   MPG 111 04/20/2018   No results found for: PROLACTIN Lab Results  Component Value Date   CHOL 161 04/20/2018   TRIG 73 04/20/2018   HDL 29 (L) 04/20/2018   CHOLHDL 5.6 04/20/2018   VLDL 15 04/20/2018   LDLCALC 117 (H) 04/20/2018   See Psychiatric Specialty Exam and Suicide Risk Assessment completed by Attending Physician prior to discharge.  Discharge destination:  Home  Is patient on multiple antipsychotic therapies at discharge:  No   Has Patient had three or more failed trials of antipsychotic monotherapy by history:  No  Recommended Plan for Multiple Antipsychotic Therapies: NA  Allergies as of 04/23/2018      Reactions   Darvocet [propoxyphene N-acetaminophen] Hives   Ibuprofen    Ultram [tramadol  Hcl] Nausea And Vomiting      Medication List    STOP taking these medications   acetaminophen 500 MG tablet Commonly known as:  TYLENOL     TAKE these medications     Indication  citalopram 10 MG tablet Commonly known as:  CELEXA Take 1 tablet (10 mg total) by mouth daily. For depression Start taking on:  04/24/2018  Indication:  Depression   hydrOXYzine 25 MG tablet Commonly known as:  ATARAX/VISTARIL Take 1 tablet (25 mg total) by mouth every 6 (six) hours as needed for anxiety.  Indication:  Feeling Anxious   nicotine 21 mg/24hr patch Commonly known as:  NICODERM CQ - dosed in mg/24 hours Place 1 patch (21 mg total) onto the skin daily. (May buy from over the counter): For smoking cessation Start taking on:  04/24/2018  Indication:  Nicotine Addiction   traZODone 50 MG tablet Commonly known as:  DESYREL Take 1 tablet (50 mg total) by mouth at bedtime as  needed for sleep.  Indication:  Trouble Sleeping      Follow-up Information    [facility]re LainMargaret Bowen. Go on 04/28/2018.   Why:  Please attend your appointment on Monday, 04/28/18 at 8:00a. Please bring your photo ID, proof of insurance, and discharge paperwork from this hospitalization.  Contact information: [address] [place], [place] [address] phone: [phone] fax:        [facility]. Go on 04/22/2018.   Why:  Please attend your hospital follow up appointment on Friday, 04/25/18 at 10:30a.  Please bring photo ID, proof of insurance, social security card, and any discahrge paperwork from this hospitalization.  Contact information: [address] [place] [address] [phone]          Follow-up recommendations: Activity:  As tolerated Diet: As recommended by your primary care doctor. Keep all scheduled follow-up appointments as recommended.   Comments: Patient is instructed prior to discharge to: Take all medications as prescribed by his/her mental healthcare provider. Report any adverse  effects and or reactions from the medicines to his/her outpatient provider promptly. Patient has been instructed & cautioned: To not engage in alcohol and or illegal drug use while on prescription medicines. In the event of worsening symptoms, patient is instructed to call the crisis hotline, 911 and or go to the nearest ED for appropriate evaluation and treatment of symptoms. To follow-up with his/her primary care provider for your other medical issues, concerns and or health care needs.   Signed: Armandina StammerAgnes Nwoko, NP, PMHNP, FNP-BC 04/23/2018, 10:37 AM

## 2018-04-23 NOTE — Progress Notes (Signed)
Recreation Therapy Notes  Date: 11.20.19 Time: 0930 Location: [address] Dayroom  Group Topic: Stress Management  Goal Area(s) Addresses:  Patient will verbalize importance of using healthy stress management.  Patient will identify positive emotions associated with healthy stress management.   Intervention: Stress Management  Activity :  Guided Imagery.  LRT introduced the stress management technique of guided imagery.  LRT read a script to guide patients through a peaceful meadow.  Patients were to follow along as script was read to engage in activity.  Education:  Stress Management, Discharge Planning.   Education Outcome: Acknowledges edcuation/In group clarification offered/Needs additional education  Clinical Observations/Feedback: Pt did not attend group.    Caroll RancherMarjette Diyana Starrett, LRT/CTRS         Lillia AbedLindsay, Trica Usery A 04/23/2018 11:30 AM

## 2018-04-23 NOTE — [facility] Suicide Risk Assessment (Signed)
[facility]   Principal Problem: <principal problem not specified> Discharge Diagnoses: Active Problems:   Severe recurrent major depression without psychotic features (HCC)   Opioid dependence with opioid-induced mood disorder (HCC)   Substance induced mood disorder (HCC)   Benzodiazepine dependence (HCC)   Opiate withdrawal (HCC)   Total Time spent with patient: 15 minutes  Musculoskeletal: Strength & Muscle Tone: within normal limits Gait & Station: normal Patient leans: N/A  Psychiatric Specialty Exam: Review of Systems  All other systems reviewed and are negative.   Blood pressure 121/77, pulse 67, temperature 97.6 F (36.4 C), temperature source Oral, resp. rate 20, height 5' 8.9" (1.75 m), weight 63 kg, SpO2 98 %.Body mass index is 20.59 kg/m.  General Appearance: Casual  Eye Contact::  Fair  Speech:  Normal Rate409  Volume:  Normal  Mood:  Anxious  Affect:  Congruent  Thought Process:  Coherent and Descriptions of Associations: Intact  Orientation:  Full (Time, Place, and Person)  Thought Content:  Logical  Suicidal Thoughts:  No  Homicidal Thoughts:  No  Memory:  Immediate;   Fair Recent;   Fair Remote;   Fair  Judgement:  Intact  Insight:  Fair  Psychomotor Activity:  Normal  Concentration:  Good  Recall:  Good  Fund of Knowledge:Good  Language: Good  Akathisia:  Negative  Handed:  Right  AIMS (if indicated):     Assets:  Communication Skills Desire for Improvement Housing Physical Health Resilience  Sleep:  Number of Hours: 6.75  Cognition: WNL  ADL's:  Intact   Mental Status Per Nursing Assessment::   On Admission:  NA  Demographic Factors:  Male, Caucasian, Low socioeconomic status and Unemployed  Loss Factors: NA  Historical Factors: Impulsivity  Risk Reduction Factors:   Employed, Living with another person, especially a relative and Positive social support  Continued Clinical Symptoms:  Depression:    Comorbid alcohol abuse/dependence Impulsivity Alcohol/Substance Abuse/Dependencies  Cognitive Features That Contribute To Risk:  None    Suicide Risk:  Minimal: No identifiable suicidal ideation.  Patients presenting with no risk factors but with morbid ruminations; may be classified as minimal risk based on the severity of the depressive symptoms  Follow-up Information    [facility] LainMargaret Bowen. Go on 04/28/2018.   Why:  Please attend your appointment on Monday, 04/28/18 at 8:00a. Please bring your photo ID, proof of insurance, and discharge paperwork from this hospitalization.  Contact information: [address] [place], [place] [address] phone: [phone] fax:        [facility]. Go on 04/22/2018.   Why:  Please attend your hospital follow up appointment on Friday, 04/25/18 at 10:30a.  Please bring photo ID, proof of insurance, social security card, and any discahrge paperwork from this hospitalization.  Contact information: [address] [place] [address] [phone]           Plan Of Care/Follow-up recommendations:  Activity:  ad lib  Antonieta PertGreg Lawson [place] Shannahan, MD 04/23/2018, 9:43 AM

## 2019-03-24 ENCOUNTER — Other Ambulatory Visit: Payer: Self-pay

## 2019-03-24 ENCOUNTER — Encounter (HOSPITAL_COMMUNITY): Payer: Self-pay | Admitting: *Deleted

## 2019-03-24 ENCOUNTER — Emergency Department (HOSPITAL_COMMUNITY)
Admission: EM | Admit: 2019-03-24 | Discharge: 2019-03-24 | Disposition: A | Discharge status: Home / Self Care | Payer: Self-pay | Attending: Emergency Medicine | Admitting: Emergency Medicine

## 2019-03-24 DIAGNOSIS — K047 Periapical abscess without sinus: Secondary | ICD-10-CM | POA: Insufficient documentation

## 2019-03-24 DIAGNOSIS — F17210 Nicotine dependence, cigarettes, uncomplicated: Secondary | ICD-10-CM | POA: Insufficient documentation

## 2019-03-24 MED ORDER — PENICILLIN V POTASSIUM 250 MG PO TABS
500.0000 mg | ORAL_TABLET | Freq: Once | ORAL | Status: AC
  Administered 2019-03-24: 21:00:00 500 mg via ORAL
  Filled 2019-03-24: qty 2

## 2019-03-24 MED ORDER — PENICILLIN V POTASSIUM 500 MG PO TABS: 500.0000 mg | ORAL_TABLET | Freq: Four times a day (QID) | ORAL | 0 refills | Status: AC

## 2019-03-24 MED ORDER — LIDOCAINE-EPINEPHRINE (PF) 2 %-1:200000 IJ SOLN
10.0000 mL | Freq: Once | INTRAMUSCULAR | Status: AC
  Administered 2019-03-24: 10 mL
  Filled 2019-03-24: qty 10

## 2019-03-24 NOTE — ED Triage Notes (Signed)
Patient presents to the ED with left lower tooth pain for 3 days.  Patient has used goodys otc, tylenol and orajel with no relief.

## 2019-03-24 NOTE — Discharge Instructions (Addendum)
Take antibiotics as prescribed.  Take entire course, even if your symptoms improve. Use Tylenol as needed for pain. Make sure to stay well hydrated with water. Follow-up with your dentist for further evaluation of the teeth. Return to the emergency room if you develop high fevers, inability to open up your mouth, or severe/worsening pain.  Return with any new, worsening, or concerning symptoms.

## 2019-03-24 NOTE — ED Provider Notes (Signed)
[facility] EMERGENCY DEPARTMENT Provider Note   CSN: [id number] Arrival date & time: 03/24/19  1726     History   Chief Complaint Chief Complaint  Patient presents with  . Dental Pain    bottome left    HPI Marcus Gill is a 38 y.o. male presenting for evaluation of tooth pain.  Patient states 3 days ago, he started develop pain of his left lower tooth.  Pain was throbbing and constant.  Last night, he developed significant swelling of his left cheek.  Pain worsened.  He has been taking Tylenol, Goody powders, and Orajel without improvement of symptoms.  He denies fevers, chills, difficulty breathing, difficulty swallowing, nausea, vomiting.  He has no other medical problems, takes no medications daily.  He has had problems with this tooth before, but has been unable to afford following up with dentistry.      HPI  Past Medical History:  Diagnosis Date  . Clavicle fracture     Patient Active Problem List   Diagnosis Date Noted  . Opioid dependence with opioid-induced mood disorder ([place])   . Substance induced mood disorder ([place])   . Benzodiazepine dependence ([place])   . Opiate withdrawal ([place])   . Severe recurrent major depression without psychotic features ([place]) 04/19/2018    History reviewed. No pertinent surgical history.      Home Medications    Prior to Admission medications   Medication Sig Start Date End Date Taking? Authorizing Provider  citalopram (CELEXA) 10 MG tablet Take 1 tablet (10 mg total) by mouth daily. For depression 04/24/18   Lindell Spar I, NP  hydrOXYzine (ATARAX/VISTARIL) 25 MG tablet Take 1 tablet (25 mg total) by mouth every 6 (six) hours as needed for anxiety. 04/23/18   Lindell Spar I, NP  nicotine (NICODERM CQ - DOSED IN MG/24 HOURS) 21 mg/24hr patch Place 1 patch (21 mg total) onto the skin daily. (May buy from over the counter): For smoking cessation 04/24/18   Lindell Spar I, NP  penicillin v potassium (VEETID) 500 MG tablet Take 1  tablet (500 mg total) by mouth 4 (four) times daily for 7 days. 03/24/19 03/31/19  Aaden Buckman, PA-C  traZODone (DESYREL) 50 MG tablet Take 1 tablet (50 mg total) by mouth at bedtime as needed for sleep. 04/23/18   Encarnacion Slates, NP    Family History History reviewed. No pertinent family history.  Social History Social History   Tobacco Use  . Smoking status: Current Every Day Smoker    Packs/day: 2.00    Years: 15.00    Pack years: 30.00    Types: Cigarettes  . Smokeless tobacco: Never Used  Substance Use Topics  . Alcohol use: Never    Frequency: Never  . Drug use: Not Currently    Comment: opiates, snort heroin 3 weeks ago     Allergies   Aleve [naproxen sodium], Darvocet [propoxyphene n-acetaminophen], Ibuprofen, and Ultram [tramadol hcl]   Review of Systems Review of Systems  Constitutional: Negative for fever.  HENT: Positive for dental problem.      Physical Exam Updated Vital Signs BP (!) 124/95 (BP Location: Right Arm)   Pulse 70   Temp 98.1 F (36.7 C) (Oral)   Resp 18   Ht 6\' 1"  (1.854 m)   Wt 74.8 kg   SpO2 100%   BMI 21.77 kg/m   Physical Exam Vitals signs and nursing note reviewed.  Constitutional:      General: He is not in  acute distress.    Appearance: He is well-developed.  HENT:     Head: Normocephalic and atraumatic.     Mouth/Throat:      Comments: Overall poor dentition.  Multiple teeth missing with dental caries.  Visible and palpable abscess around the left lower tooth without active drainage.  No swelling or tenderness under the tongue.  No swelling under the angle of the mandible.  Airway intact. Neck:     Musculoskeletal: Normal range of motion.  Cardiovascular:     Rate and Rhythm: Normal rate and regular rhythm.     Pulses: Normal pulses.  Pulmonary:     Effort: Pulmonary effort is normal.     Breath sounds: Normal breath sounds.  Abdominal:     General: There is no distension.  Musculoskeletal: Normal range of  motion.  Skin:    General: Skin is warm.     Findings: No rash.  Neurological:     Mental Status: He is alert and oriented to person, place, and time.      ED Treatments / Results  Labs (all labs ordered are listed, but only abnormal results are displayed) Labs Reviewed - No data to display  EKG None  Radiology No results found.  Procedures .Marland KitchenIncision and Drainage  Date/Time: 03/24/2019 10:25 PM Performed by: Alveria Apley, PA-C Authorized by: Alveria Apley, PA-C   Consent:    Consent obtained:  Verbal   Consent given by:  Patient   Risks discussed:  Bleeding, incomplete drainage, infection, pain and damage to other organs Location:    Location:  Mouth   Mouth location:  Alveolar process Anesthesia (see MAR for exact dosages):    Anesthesia method:  Nerve block   Block location:  L inferor alveolar    Block needle gauge:  25 G   Block anesthetic:  Lidocaine 2% WITH epi   Block injection procedure:  Anatomic landmarks identified, introduced needle, negative aspiration for blood and incremental injection   Block outcome:  Anesthesia achieved Procedure type:    Complexity:  Simple Procedure details:    Incision types:  Single straight   Incision depth:  Dermal   Scalpel blade:  11   Drainage:  Purulent   Drainage amount:  Copious   Packing materials:  None Post-procedure details:    Patient tolerance of procedure:  Tolerated well, no immediate complications   (including critical care time)  Medications Ordered in ED Medications  lidocaine-EPINEPHrine (XYLOCAINE W/EPI) 2 %-1:200000 (PF) injection 10 mL (10 mLs Infiltration Given 03/24/19 2010)  penicillin v potassium (VEETID) tablet 500 mg (500 mg Oral Given 03/24/19 2041)     Initial Impression / Assessment and Plan / ED Course  I have reviewed the triage vital signs and the nursing notes.  Pertinent labs & imaging results that were available during my care of the patient were reviewed by me and  considered in my medical decision making (see chart for details).        Patient presenting for evaluation of dental abscess.  Physical exam shows patient who appears nontoxic.  No signs of systemic infection.  Exam is not consistent with [place].  Obvious abscess on exam.  Dental block and I&D performed as described above.  Discussed importance of follow-up with dentistry.  Antibiotics given.  At this time, patient appears safe for discharge.  Return precautions given.  Patient states he understands and agrees to plan.  Final Clinical Impressions(s) / ED Diagnoses   Final diagnoses:  Dental abscess  ED Discharge Orders         Ordered    penicillin v potassium (VEETID) 500 MG tablet  4 times daily     03/24/19 2029           Alveria ApleyCaccavale, Colie Fugitt, PA-C 03/24/19 2228    Derwood KaplanNanavati, Ankit, MD 03/26/19 1825

## 2019-10-28 NOTE — Telephone Encounter (Signed)
Error

## 2022-02-25 ENCOUNTER — Emergency Department (HOSPITAL_COMMUNITY): Payer: Self-pay

## 2022-02-25 ENCOUNTER — Emergency Department (HOSPITAL_COMMUNITY)
Admission: EM | Admit: 2022-02-25 | Discharge: 2022-02-25 | Disposition: A | Discharge status: Home / Self Care | Payer: Self-pay | Attending: Emergency Medicine | Admitting: Emergency Medicine

## 2022-02-25 ENCOUNTER — Other Ambulatory Visit: Payer: Self-pay

## 2022-02-25 ENCOUNTER — Encounter (HOSPITAL_COMMUNITY): Payer: Self-pay

## 2022-02-25 DIAGNOSIS — X500XXA Overexertion from strenuous movement or load, initial encounter: Secondary | ICD-10-CM | POA: Insufficient documentation

## 2022-02-25 DIAGNOSIS — M25511 Pain in right shoulder: Secondary | ICD-10-CM | POA: Insufficient documentation

## 2022-02-25 MED ORDER — KETOROLAC TROMETHAMINE 30 MG/ML IJ SOLN
30.0000 mg | Freq: Once | INTRAMUSCULAR | Status: AC
  Administered 2022-02-25: 30 mg via INTRAMUSCULAR
  Filled 2022-02-25: qty 1

## 2022-02-25 MED ORDER — METHOCARBAMOL 500 MG PO TABS: 500.0000 mg | ORAL_TABLET | Freq: Three times a day (TID) | ORAL | 0 refills | Status: DC | PRN

## 2022-02-25 NOTE — Discharge Instructions (Signed)

## 2022-02-25 NOTE — ED Provider Notes (Signed)
Emergency Department Provider Note   I have reviewed the triage vital signs and the nursing notes.   HISTORY  Chief Complaint Arm Injury   HPI Marcus Gill is a 41 y.o. male presents emergency department for evaluation of right shoulder pain.  Patient states he was clearing brush and lifting some heavy sticks when he felt pain in the right shoulder.  He had difficulty moving the right shoulder.  No numbness or weakness.  No pain in the elbow or wrist.  No neck or back pain.    Past Medical History:  Diagnosis Date   Clavicle fracture     Review of Systems  Musculoskeletal: Positive right shoulder pain.  Neurological: Negative for numbness/weakness.   ____________________________________________   PHYSICAL EXAM:  VITAL SIGNS: ED Triage Vitals [02/25/22 1940]  Enc Vitals Group     BP 129/79     Pulse Rate 70     Resp 16     Temp 98 F (36.7 C)     Temp Source Oral     SpO2 98 %     Weight 140 lb (63.5 kg)     Height 6\' 1"  (1.854 m)   Constitutional: Alert and oriented. Well appearing and in no acute distress. Eyes: Conjunctivae are normal.  Head: Atraumatic. Nose: No congestion/rhinnorhea. Mouth/Throat: Mucous membranes are moist.  Neck: No stridor. No cervical spine tenderness to palpation. Cardiovascular: Good peripheral circulation. Respiratory: Normal respiratory effort.   Gastrointestinal: No distention.  Musculoskeletal: Pain with shoulder abduction beyond 90 degrees. No tenderness to the elbow or wrist.  Neurologic:  Normal speech and language. Normal grip strength in the right arm. Normal sensation.  Skin:  Skin is warm, dry and intact. No rash noted. ____________________________________________  RADIOLOGY  DG Shoulder Right  Result Date: 02/25/2022 CLINICAL DATA:  Trauma, pain EXAM: RIGHT SHOULDER - 2+ VIEW COMPARISON:  06/25/2015 FINDINGS: No recent fracture or dislocation is seen. Minimal deformity in the lateral end of right clavicle may  be residual from previous injury. IMPRESSION: No fracture or dislocation is seen in right shoulder. Electronically Signed   By: Elmer Picker M.D.   On: 02/25/2022 20:20    ____________________________________________   PROCEDURES  Procedure(s) performed:   Procedures  None  ____________________________________________   INITIAL IMPRESSION / ASSESSMENT AND PLAN / ED COURSE  Pertinent labs & imaging results that were available during my care of the patient were reviewed by me and considered in my medical decision making (see chart for details).   This patient is Presenting for Evaluation of shoulder pain, which does require a range of treatment options, and is a complaint that involves a moderate risk of morbidity and mortality.  The Differential Diagnoses include rotator cuff injury, contusion, strain, fracture, dislocation, etc.  Critical Interventions-    Medications  ketorolac (TORADOL) 30 MG/ML injection 30 mg (30 mg Intramuscular Given 02/25/22 2239)    Reassessment after intervention: Pain improved.    Radiologic Tests Ordered, included shoulder x ray. I independently interpreted the images and agree with radiology interpretation.   Medical Decision Making: Summary:  Patient presents emergency department the right shoulder pain after injury.  The joint is well located without fracture.  Suspect rotator cuff injury versus strain.  Plan for NSAIDs, ROM exercises, and ortho follow up.   Disposition: discharge  ____________________________________________  FINAL CLINICAL IMPRESSION(S) / ED DIAGNOSES  Final diagnoses:  Pain in joint of right shoulder     NEW OUTPATIENT MEDICATIONS STARTED DURING THIS VISIT:  Discharge Medication List as of 02/25/2022 10:32 PM     START taking these medications   Details  methocarbamol (ROBAXIN) 500 MG tablet Take 1 tablet (500 mg total) by mouth every 8 (eight) hours as needed for muscle spasms., Starting Sun 02/25/2022,  Normal        Note:  This document was prepared using Dragon voice recognition software and may include unintentional dictation errors.  Nanda Quinton, MD, [facility] Emergency Medicine    Ebonie Westerlund, [facility], MD 02/26/22 [phone]

## 2022-02-25 NOTE — ED Notes (Signed)
Patient verbalizes understanding of discharge instructions. Opportunity for questioning and answers were provided. Armband removed by staff, pt discharged from ED. Ambulated out to lobby  

## 2022-02-25 NOTE — ED Triage Notes (Signed)
Pt arrived via POV c/o right arm right neck pain, after working in his yard today. Pt has limited movement in RUE and limited movement with his neck. Pt reports numbness traveling down RUE from neck to elbow. Pt denies falling, or injuring RUE. Distal pulse and perfusion of distal extremity intact.

## 2022-03-08 ENCOUNTER — Ambulatory Visit: Payer: Self-pay | Admitting: Physician Assistant

## 2022-03-08 ENCOUNTER — Encounter: Payer: Self-pay | Admitting: Physician Assistant

## 2022-03-08 ENCOUNTER — Telehealth: Payer: Self-pay

## 2022-03-08 VITALS — BP 116/79 | HR 63 | Temp 98.0°F | Ht 71.0 in | Wt 154.0 lb

## 2022-03-08 DIAGNOSIS — F17200 Nicotine dependence, unspecified, uncomplicated: Secondary | ICD-10-CM

## 2022-03-08 DIAGNOSIS — Z7689 Persons encountering health services in other specified circumstances: Secondary | ICD-10-CM

## 2022-03-08 DIAGNOSIS — M25511 Pain in right shoulder: Secondary | ICD-10-CM

## 2022-03-08 MED ORDER — CYCLOBENZAPRINE HCL 10 MG PO TABS: 10.0000 mg | ORAL_TABLET | Freq: Three times a day (TID) | ORAL | 0 refills | Status: AC | PRN

## 2022-03-08 NOTE — Telephone Encounter (Signed)
Called client for follow up. Discussed recommendations of provider. He states all went well today at this appointment. No labs needed. Flexeril ordered to [facility] and discussed that will come in the mail to him in a few days. Warned not to drive due to possible drowsiness from medication and he states understanding.   Will follow as needed.   [place] [employer]

## 2022-03-08 NOTE — Progress Notes (Signed)
BP 116/79   Pulse 63   Temp 98 F (36.7 C)   Ht 5\' 11"  (1.803 m)   Wt 154 lb (69.9 kg)   SpO2 96%   BMI 21.48 kg/m    Subjective:    Patient ID: Marcus Gill, male    DOB: [birth year], 41 y.o.   MRN: [id number]  HPI: Marcus Gill is a 41 y.o. male presenting on 03/08/2022 for New Patient (Initial Visit)   HPI  Chief Complaint  Patient presents with   New Patient (Initial Visit)     Pt is not working currently.  Pt c/o shoulder still hurting from where he broke his collarbbone 7 years ago.  He went to ER last month for this.  Xray was unremarkable.    He went to [place] in 2017 when he had the clavicle fracture.  He didn't get the rx that the ER gave him (methocarbamol) because he said he didn't have the money.   He used some icy hot but says that didn't help.   Pt is left hand dominant.  He got covid vax but doesn't have his card  He Denies anxiety/depression.  He says he is having no issues other than the shoulder.      Relevant past medical, surgical, family and social history reviewed and updated as indicated. Interim medical history since our last visit reviewed. Allergies and medications reviewed and updated.     No current outpatient medications on file.    Review of Systems  Per HPI unless specifically indicated above     Objective:    BP 116/79   Pulse 63   Temp 98 F (36.7 C)   Ht 5\' 11"  (1.803 m)   Wt 154 lb (69.9 kg)   SpO2 96%   BMI 21.48 kg/m   Wt Readings from Last 3 Encounters:  03/08/22 154 lb (69.9 kg)  02/25/22 140 lb (63.5 kg)  03/24/19 165 lb (74.8 kg)    Physical Exam Vitals reviewed.  Constitutional:      General: He is not in acute distress.    Appearance: He is well-developed. He is not toxic-appearing.  HENT:     Head: Normocephalic and atraumatic.     Right Ear: Tympanic membrane, ear canal and external ear normal.     Left Ear: Tympanic membrane, ear canal and external ear normal.  Eyes:      Extraocular Movements: Extraocular movements intact.     Conjunctiva/sclera: Conjunctivae normal.     Pupils: Pupils are equal, round, and reactive to light.  Neck:     Thyroid: No thyromegaly.  Cardiovascular:     Rate and Rhythm: Normal rate and regular rhythm.  Pulmonary:     Effort: Pulmonary effort is normal.     Breath sounds: Normal breath sounds. No wheezing or rales.  Abdominal:     General: Bowel sounds are normal.     Palpations: Abdomen is soft. There is no mass.     Tenderness: There is no abdominal tenderness.  Musculoskeletal:     Right shoulder: Tenderness present. No swelling or deformity. Decreased range of motion.     Cervical back: Neck supple.     Right lower leg: No edema.     Left lower leg: No edema.     Comments: There is tenderness right shoulder posteriorly along upper scapula and anteriorly near a-c joint.   Pt will no abduct or extend to greater than 90 degrees due to pain.  Int/ext rotation normal.   Lymphadenopathy:     Cervical: No cervical adenopathy.  Skin:    General: Skin is warm and dry.     Findings: No rash.  Neurological:     Mental Status: He is alert and oriented to person, place, and time.  Psychiatric:        Behavior: Behavior normal.         Assessment & Plan:    Encounter Diagnoses  Name Primary?   Encounter to establish care Yes   Right shoulder pain, unspecified chronicity    Tobacco use disorder      Full labs done 2019.  None need repeat at this time  Pt with problems nsaids.    Offered flexeril or orthopedics.  Pt prefers muscle relaxer.  Rx sent to [facility].  Pt counseled on sedation and to avoid driving on this medication.   Also encouraged ice/heat for 10-20 minutes 3 or 4 times daily  Check on cafa  Pt is scheduled to RTO 2 months for recheck shoulder.  He is to contact office sooner prn

## 2022-05-10 ENCOUNTER — Ambulatory Visit: Payer: Self-pay | Admitting: Physician Assistant

## 2023-02-25 ENCOUNTER — Telehealth: Payer: Self-pay

## 2023-02-25 NOTE — Telephone Encounter (Signed)
Attempted wellness follow up. Client of [facility], no answer, left message.  Client last seen at [facility] on 03/08/22 and no future appointments noted. [facility] reminder letter mailed for renewal on 12/04/22    Francee Nodal RN Clara Gunn/[facility]
